# Patient Record
Sex: Female | Born: 1965 | Race: Black or African American | Hispanic: No | Marital: Single | Smoking: Never smoker
Health system: Southern US, Community
[De-identification: ages and names within clinical notes are randomized; demographics above are authoritative.]

## PROBLEM LIST (undated history)

## (undated) DIAGNOSIS — T4145XA Adverse effect of unspecified anesthetic, initial encounter: Secondary | ICD-10-CM

## (undated) DIAGNOSIS — E119 Type 2 diabetes mellitus without complications: Secondary | ICD-10-CM

## (undated) DIAGNOSIS — T8859XA Other complications of anesthesia, initial encounter: Secondary | ICD-10-CM

## (undated) DIAGNOSIS — K219 Gastro-esophageal reflux disease without esophagitis: Secondary | ICD-10-CM

## (undated) DIAGNOSIS — R51 Headache: Secondary | ICD-10-CM

## (undated) DIAGNOSIS — J302 Other seasonal allergic rhinitis: Secondary | ICD-10-CM

## (undated) DIAGNOSIS — M5430 Sciatica, unspecified side: Secondary | ICD-10-CM

## (undated) DIAGNOSIS — I10 Essential (primary) hypertension: Secondary | ICD-10-CM

## (undated) DIAGNOSIS — M5126 Other intervertebral disc displacement, lumbar region: Secondary | ICD-10-CM

## (undated) HISTORY — PX: DIAGNOSTIC LAPAROSCOPY: SUR761

---

## 1998-03-14 ENCOUNTER — Ambulatory Visit (HOSPITAL_COMMUNITY): Admission: RE | Admit: 1998-03-14 | Discharge: 1998-03-14 | Payer: Self-pay | Admitting: Family Medicine

## 1998-04-30 ENCOUNTER — Ambulatory Visit (HOSPITAL_COMMUNITY): Admission: RE | Admit: 1998-04-30 | Discharge: 1998-04-30 | Payer: Self-pay | Admitting: Obstetrics and Gynecology

## 2004-03-28 ENCOUNTER — Other Ambulatory Visit: Admission: RE | Admit: 2004-03-28 | Discharge: 2004-03-28 | Payer: Self-pay | Admitting: Obstetrics and Gynecology

## 2004-04-15 ENCOUNTER — Inpatient Hospital Stay (HOSPITAL_COMMUNITY): Admission: AD | Admit: 2004-04-15 | Discharge: 2004-04-16 | Payer: Self-pay | Admitting: Obstetrics and Gynecology

## 2004-09-19 ENCOUNTER — Inpatient Hospital Stay (HOSPITAL_COMMUNITY): Admission: AD | Admit: 2004-09-19 | Discharge: 2004-09-22 | Payer: Self-pay | Admitting: Obstetrics and Gynecology

## 2004-09-19 ENCOUNTER — Encounter (INDEPENDENT_AMBULATORY_CARE_PROVIDER_SITE_OTHER): Payer: Self-pay | Admitting: *Deleted

## 2004-10-26 ENCOUNTER — Other Ambulatory Visit: Admission: RE | Admit: 2004-10-26 | Discharge: 2004-10-26 | Payer: Self-pay | Admitting: Obstetrics and Gynecology

## 2005-08-27 HISTORY — PX: ABDOMINAL HYSTERECTOMY: SHX81

## 2006-05-06 ENCOUNTER — Ambulatory Visit (HOSPITAL_COMMUNITY): Admission: RE | Admit: 2006-05-06 | Discharge: 2006-05-07 | Payer: Self-pay | Admitting: Obstetrics and Gynecology

## 2006-05-06 ENCOUNTER — Encounter (INDEPENDENT_AMBULATORY_CARE_PROVIDER_SITE_OTHER): Payer: Self-pay | Admitting: *Deleted

## 2008-08-14 ENCOUNTER — Emergency Department (HOSPITAL_COMMUNITY): Admission: EM | Admit: 2008-08-14 | Discharge: 2008-08-14 | Payer: Self-pay | Admitting: Emergency Medicine

## 2008-11-25 ENCOUNTER — Emergency Department (HOSPITAL_COMMUNITY): Admission: EM | Admit: 2008-11-25 | Discharge: 2008-11-25 | Payer: Self-pay | Admitting: Emergency Medicine

## 2009-06-09 ENCOUNTER — Emergency Department (HOSPITAL_COMMUNITY): Admission: EM | Admit: 2009-06-09 | Discharge: 2009-06-09 | Payer: Self-pay | Admitting: Family Medicine

## 2010-02-22 ENCOUNTER — Emergency Department (HOSPITAL_COMMUNITY): Admission: EM | Admit: 2010-02-22 | Discharge: 2010-02-22 | Payer: Self-pay | Admitting: Family Medicine

## 2010-11-12 LAB — HEMOCCULT GUIAC POC 1CARD (OFFICE): Fecal Occult Bld: POSITIVE

## 2010-12-26 ENCOUNTER — Inpatient Hospital Stay (INDEPENDENT_AMBULATORY_CARE_PROVIDER_SITE_OTHER)
Admission: RE | Admit: 2010-12-26 | Discharge: 2010-12-26 | Disposition: A | Discharge status: Home / Self Care | Payer: 59 | Source: Ambulatory Visit | Attending: Emergency Medicine | Admitting: Emergency Medicine

## 2010-12-26 DIAGNOSIS — B373 Candidiasis of vulva and vagina: Secondary | ICD-10-CM

## 2010-12-26 DIAGNOSIS — N390 Urinary tract infection, site not specified: Secondary | ICD-10-CM

## 2010-12-26 LAB — POCT URINALYSIS DIP (DEVICE)
Glucose, UA: NEGATIVE mg/dL
Nitrite: NEGATIVE
Protein, ur: NEGATIVE mg/dL
Urobilinogen, UA: 0.2 mg/dL (ref 0.0–1.0)

## 2010-12-26 LAB — WET PREP, GENITAL: Yeast Wet Prep HPF POC: NONE SEEN

## 2010-12-27 LAB — GC/CHLAMYDIA PROBE AMP, GENITAL: GC Probe Amp, Genital: NEGATIVE

## 2011-01-12 NOTE — Discharge Summary (Signed)
Samantha Clark, Samantha Clark                 ACCOUNT NO.:  [id number]   MEDICAL RECORD NO.:  [id number]          PATIENT TYPE:  INP   LOCATION:  9121                          FACILITY:  [facility]   PHYSICIAN:  Dineen Kid. Rana Snare, M.D.    DATE OF BIRTH:  12-02-1965   DATE OF ADMISSION:  09/19/2004  DATE OF DISCHARGE:  09/22/2004                                 DISCHARGE SUMMARY   ADMITTING DIAGNOSIS:  1.  Intrauterine pregnancy at 35-4/7 weeks estimated gestational age.  2.  Chronic hypertension.  3.  Decreased fetal movement.  4.  Borderline amniotic fluid index, with intrauterine growth retardation      noted on baby, with abnormal Doppler study.   DISCHARGE DIAGNOSES:  1.  Status post low transverse cesarean section secondary to failure to      progress and intrauterine growth retardation.  2.  Viable female infant.   PROCEDURE:  Primary low transverse cesarean section.   REASON FOR ADMISSION:  Please see written H&P.   HOSPITAL COURSE:  The patient is a 45 year old gravida 2 para 1 who was  admitted to [facility] at 35-4/7 weeks estimated  gestational age for an induction of labor.  The patient had presented from  the office with decreased fetal movement over the past few days.  The  patient had chronic hypertension which was controlled with atenolol.  Ultrasound had been performed today which revealed an estimated fetal weight  in the less than 10th percentile for gestational age.  AFI was 6.5.  Doppler  studies revealed uterine artery at 3.5 and MCA which was within normal  limits.  Biophysical profile was 8/8.  Group B beta Streptococcus culture  was unknown, and therefore IV antibiotics were given prophylactically.  Blood pressure on admission was 132.77.  Fetal heart tones were reactive,  with an occasional spontaneous deceleration which was noted.  The patient  was without contractions.  Cervix was 1 cm dilated, with vertex that was  high in the pelvis.  PIH labs were  within normal limits.  Decision was made  to place Cervidil for ripening up the cervix, with close monitoring for  further decelerations.  Later that evening, review of the tracing revealed  fetal heart tones which were decreased variability and three spontaneous  variable decelerations which were questionable late in their presentation.  Decision was made to proceed with the cesarean delivery due to nonreassuring  fetal heart tones.  The patient was then taken to the operating room, where  spinal anesthesia was administered without difficulty.  A low transverse  incision was made, with the delivery a viable female infant weighing 4 pounds  9 ounces, with Apgars of 6 at one minute and 9 at five minutes.  Baby was  taken to the NICU, and the patient tolerated the procedure well and was  taken to the recovery room in stable condition.  On postoperative day one,  the patient did complain of some nausea without vomiting.  Vital signs were  stable.  She was afebrile.  Blood pressure was 135/90-139/84.  Deep tendon  reflexes  were 1+ without clonus.  Abdomen was soft, with good return of  bowel function.  Abdominal dressing was noted to have a small amount of  drainage noted on the right margin of the dressing.  Labs revealed  hemoglobin of 11.3, WBC count of 15.6, platelet count of 240,000, AST was  17, ALT was 9.  On postoperative day two, the patient was without complaint.  Baby was stable in the NICU.  Vital signs were stable.  Patient was  afebrile.  Blood pressure 129/79-134/78.  Abdomen was soft.  Fundus was firm  and nontender.  Abdominal dressing was clean, dry, and intact.  On  postoperative day three, the patient denied headache, blurred vision, or CNS  symptoms.  Vital signs were stable.  She was afebrile.  Blood pressure 129-  151/79-97.  Abdomen was soft.  Fundus was firm and nontender.  Incision was  clean, dry, and intact.  Staples removed, and the patient was discharged  home.    CONDITION ON DISCHARGE:  Stable.   DIET:  Regular, as tolerated.   ACTIVITY:  No heavy lifting, no driving x 2 weeks, no vaginal entry.   FOLLOWUP:  The patient is to follow up in the office in one week for an  incision check.  She is to call for temperature greater than 100 degrees,  persistent nausea and vomiting, heavy vaginal bleeding, and/or redness or  drainage from the incisional site.  The patient was also instructed to call  for headache unrelieved with Tylenol, blurred vision, right upper quadrant  pain, or epigastric discomfort.   DISCHARGE MEDICATIONS:  1.  Tylox, #30, one p.o. q.4-6 h. p.r.n.  2.  Motrin 600 mg q.6 h. p.r.n.  3.  Atenolol 25 mg daily.  4.  Prenatal vitamins 1 p.o. daily.      CC/MEDQ  D:  10/09/2004  T:  10/09/2004  Job:  [id number]

## 2011-01-12 NOTE — Op Note (Signed)
NAMEMarland Kitchen  Samantha Clark, Samantha Clark                 ACCOUNT NO.:  [id number]   MEDICAL RECORD NO.:  [id number]          PATIENT TYPE:  AMB   LOCATION:  SDC                           [facility]ILITY:  [facility]   PHYSICIAN:  Guy Sandifer. Henderson Cloud, M.D. DATE OF BIRTH:  [birth year]   DATE OF PROCEDURE:  05/06/2006  DATE OF DISCHARGE:                                 OPERATIVE REPORT   PREOPERATIVE DIAGNOSES:  1. Menorrhagia.  2. Uterine leiomyomata.   POSTOPERATIVE DIAGNOSES:  1. Menorrhagia.  2. Uterine leiomyomata.  3. Left ovarian cyst.   PROCEDURES:  Laparoscopically-assisted vaginal hysterectomy.   SURGEON:  Harold Hedge, MD.   ASSISTANT:  Varney Baas, MD.   ANESTHESIA:  General with endotracheal intubation, Cristela Blue, MD.   SPECIMENS:  Uterus to pathology.   ESTIMATED BLOOD LOSS:  200 mL.   INDICATIONS AND CONSENT:  This patient is a 45 year old single black female,  G2, P1 with heavy menses.  Details are dictated in the history and physical.  Laparoscopically-assisted vaginal hysterectomy and removal of one or both  ovaries if distinctly abnormal has been discussed preoperatively.  The  potential risks and complications have been reviewed preoperatively  including but not limited to infection, bowel, bladder, ureteral damage,  bleeding requiring transfusion of blood products with possible transfusion  reaction, HIV and hepatitis acquisition, DVT, PE, pneumonia, fistula  formation, laparotomy, dyspareunia, pelvic pain.  All questions have been  answered and consent is signed on the chart.   FINDINGS:  Upper abdomen is grossly normal.  The uterus is about 6-8 weeks  in size with multiple small leiomyomata.  The right ovary is normal.  The  left ovary contains a 3 cm translucent cyst that spontaneously drains with  manipulation.  The anterior cul-de-sac is normal.  The posterior cul-de-sac  contains a filmy adhesion over the left uterosacral ligament.   DESCRIPTION OF PROCEDURE:  The patient is  taken to the operating room where  she is identified, placed in dorsal supine position and general anesthesia  is induced via endotracheal intubation.  She is then placed in the dorsal  lithotomy position where she is prepped abdominally and vaginally, bladder  straight catheterized.  The Hulka tenaculum was placed and a uterus  manipulator and she is draped in the sterile fashion.  A small  infraumbilical incision was made and a disposable Veress needle was placed  on the first attempt without difficulty.  A normal syringe and drop test are  noted.  2 liters of gas were then insufflated under low pressure with good  tympany in the right upper quadrant.  Then a 10/11 bladeless disposable XL  trocar sleeve was placed using direct visualization with the diagnostic  laparoscope.  This was then replaced with the operative laparoscope.  A  small suprapubic incision was made and a 5-mm bladeless disposable XL trocar  sleeve was placed under direct visualization without difficulty.  The subcu  tissues were insufflated with 0.5% plain Marcaine prior to both of the above  incisions.  After noting the above findings, the left ovarian cyst is noted  to drain  clear serous fluid upon manipulation.  Hemostasis is obtained on  the ovary with bipolar cautery.  Then using the gyrus bipolar cautery  cutting instrument, the proximal ligaments are taken down to the level of  the vesicouterine peritoneum bilaterally.  The vesicouterine peritoneum is  taken down cephalolaterally as well.  Good hemostasis is noted.  The  suprapubic trocar sleeve is removed.  All instruments are removed and  attention is turned to the vagina.  The posterior cul-de-sac is entered  sharply and the cervix is circumscribed with unipolar cautery.  The vaginal  mucosa is advanced sharply and bluntly.  Then using the gyrus bipolar  instrument, the uterosacral ligaments followed by the bladder pillars,  cardinal ligaments and uterine  vessels are taken down bilaterally.  The  anterior cul-de-sac is entered without difficulty.  After the proximal  ligaments are taken down, the fundus is delivered posteriorly.  The  remaining pedicles are taken down and the specimen is delivered.  The  uterosacral ligaments  are then plicated in the vaginal cuff bilaterally  with #0 Monocryl suture.  All suture will be #0 Monocryl unless otherwise  designated.  The uterosacral ligaments are then plicated in midline.  The  cuff is closed with figure-of-eights.  A Foley catheter is placed in the  bladder and clear urine is noted.  Attention is returned to the abdomen.  Pneumoperitoneum is reintroduced and copious irrigation is carried out.  Good hemostasis is noted across all sites.  Inspection under reduced  pneumoperitoneum again reveals good hemostasis.  The suprapubic trocar  sleeve is removed.  Pneumoperitoneum is reduced and the umbilical trocar  sleeve is removed.  A #0 Vicryl suture is used to reapproximate the  subcutaneous tissues in the umbilical incision with care being taken not to  pick up any underlying structures.  Both skin incisions are closed with  Dermabond.  All counts are correct.  The patient is awakened and taken to  the recovery room in stable condition.      Guy Sandifer Henderson Cloud, M.D.  Electronically Signed     JET/MEDQ  D:  05/06/2006  T:  05/06/2006  Job:  [id number]

## 2011-01-12 NOTE — Discharge Summary (Signed)
NAMEMarland Clark  ROSLIN, NORWOOD                 ACCOUNT NO.:  [id number]   MEDICAL RECORD NO.:  [id number]          PATIENT TYPE:  OIB   LOCATION:  9303                          [facility]ACILITY:  [facility]   PHYSICIAN:  Guy Sandifer. Henderson Cloud, M.D. DATE OF BIRTH:  09/08/1965   DATE OF ADMISSION:  05/06/2006  DATE OF DISCHARGE:  05/07/2006                                 DISCHARGE SUMMARY   ADMITTING DIAGNOSES:  1. Menorrhagia.  2. Uterine leiomyomata.   DISCHARGE DIAGNOSES:  1. Menorrhagia.  2. Uterine leiomyomata.  3. Left ovarian cyst.   PROCEDURE:  On May 06, 2006, laparoscopically-assisted vaginal  hysterectomy.   REASON FOR ADMISSION:  This patient is a 45 year old black female, G2, P1,  with heavy painful menses.  Details are dictated in the history and  physical.  She is to the hospital for surgical management.   HOSPITAL COURSE:  The patient has been in the hospital, undergoes the above  procedure.  On the evening of surgery vital signs are stable.  She is  afebrile with a clear urine output.  She has good bowel sounds and her  abdomen is soft.  On the day of discharge, hemoglobin is 11.0.  Pathology is  pending.  She is tolerating a regular diet, passing flatus, voiding well,  and ambulating.  Vital signs are stable and she is afebrile.   CONDITION ON DISCHARGE:  Good.   DIET:  Regular as tolerated.   ACTIVITY:  No lifting, no operation of automobiles, no vaginal entry.   She is to call the office for problems including, but not limited to,  temperature of 101 degrees, heavy vaginal bleeding, increasing pain or  persistent nausea, vomiting.   FOLLOWUP:  Is in the office in 1-2 weeks.   MEDICATIONS:  1. Ibuprofen 600 mg q.6 h p.r.n.  2. Multivitamin daily.  3. Percocet 5/225 mg, #30, one to two p.o. q.6 h p.r.n.   LABORATORY:  Thank you      Guy Sandifer. Henderson Cloud, M.D.  Electronically Signed     JET/MEDQ  D:  05/07/2006  T:  05/07/2006  Job:  [id number]

## 2011-01-12 NOTE — Op Note (Signed)
NAMESENTORIA, Samantha Clark                 ACCOUNT NO.:  [id number]   MEDICAL RECORD NO.:  [id number]          PATIENT TYPE:  INP   LOCATION:  9121                          [facility]ILITY[facility]   PHYSICIAN:  Michelle L. Grewal, M.D.DATE OF BIRTH:  1966-02-22   DATE OF PROCEDURE:  09/19/2004  DATE OF DISCHARGE:                                 OPERATIVE REPORT   PREOPERATIVE DIAGNOSES:  1.  Intrauterine pregnancy at 35-4/7 weeks.  2.  Chronic hypertension.  3.  Intrauterine growth restriction.  4.  Oligohydramnios.  5.  Abnormal Dopplers.  6.  Nonreassuring fetal heart rate.   POSTOPERATIVE DIAGNOSES:  1.  Intrauterine pregnancy at 35-4/7 weeks.  2.  Chronic hypertension.  3.  Intrauterine growth restriction.  4.  Oligohydramnios.  5.  Abnormal Dopplers.  6.  Nonreassuring fetal heart rate.  7.  Fibroids.   PROCEDURE:  Primary low transverse cesarean section.   SURGEON:  Michelle L. Vincente Poli, M.D.   ANESTHESIA:  Spinal.   ESTIMATED BLOOD LOSS:  500 mL.   FINDINGS:  Multiple fibroids.  A female infant with Apgars 6 at one minute, 9  at five minutes, and a weight of 1883 g.   PROCEDURE:  The patient was taken to the operating room.  She was given her  spinal without incident.  She was prepped and draped in the usual sterile  fashion.  A Foley catheter was inserted into the bladder.  A low transverse  incision was made, carried down to the fascia.  The fascia was scored in the  midline and extended laterally.  The rectus muscles were separated in the  midline and the peritoneum was entered bluntly.  The peritoneal incision was  then stretched, the bladder blade was inserted, the lower uterine segment  was identified, and the bladder flap was created sharply and then digitally.  A low transverse incision was made and it was noted that there were multiple  fibroids in the uterus.  The baby was in cephalic presentation, was  delivered easily with one pull of the vacuum.  Apgars were 6 at one  minute  and 9 at five minutes, and weight was 1883 g.  The cord was noted to be  wrapped around the baby's feet and hands.  The baby was handed to the  waiting neonatologist and taken to the neonatal ICU because of IUGR.  The  cord was clamped and cut and placenta was removed and noted to be very  mature and small.  It was sent to pathology.  The uterus was exteriorized  and cleared of all clots and debris.  There were multiple fibroids noted,  but her uterus clamped down actually extremely well with the Pitocin.  Antibiotics were also given.  The uterus was closed in a single layer using  0 chromic in a continuous running locked stitch.  It was hemostatic.  The  uterus was returned to the abdomen.  Irrigation was performed, and again  hemostasis was noted.  The peritoneum was closed using 0 Vicryl in a  continuous running stitch, and the rectus muscles were  reapproximated using  the same 0 Vicryl.  The fascia was closed using 0 Vicryl in a continuous  running stitch, starting at each corner and meeting in the midline.  After  irrigation in the subcutaneous layer and noting hemostasis, the skin was  closed with staples.  All sponge, lap and instrument counts were correct at  the end of the procedure.  The patient tolerated the procedure well and went  to the recovery room in stable condition.      MLG/MEDQ  D:  09/19/2004  T:  09/20/2004  Job:  [id number]

## 2011-01-12 NOTE — H&P (Signed)
NAMEMarland Kitchen  Samantha Clark, Samantha Clark                 ACCOUNT NO.:  [id number]   MEDICAL RECORD NO.:  [id number]          PATIENT TYPE:  AMB   LOCATION:                                [facility]   PHYSICIAN:  Guy Sandifer. Henderson Cloud, M.D.      DATE OF BIRTH:   DATE OF ADMISSION:  05/06/2006  DATE OF DISCHARGE:                                HISTORY & PHYSICAL   CHIEF COMPLAINT:  Heavy menses.   HISTORY OF PRESENT ILLNESS:  This patient is a 45 year old, single, black  female, G2, P1, who has heavy painful menses.  She is passing large clots.  This sometimes takes her off of her feet with the heavy bleeding.  She has a  history of leiomyomata noted during pregnancy.  Ultrasound in my office on  March 13, 2006, revealed the uterus measuring 8.6 x 5.2 x 6.1-cm, with 2.6  and 2.2-cm intramural fibroids, and a 3.1-cm pedunculated fibroid on the  right fundus noted.  Ovaries were essentially normal with a septate  hemorrhagic cyst on the right ovary measuring 2.9-cm.  After a careful  discussion of the options, she is being admitted for a laparoscopically  assisted vaginal hysterectomy.  She will have one or both tubes and ovaries  removed if distinctly abnormal.  Potential risks and complications have been  reviewed preoperatively.   PAST MEDICAL HISTORY:  1. History of endometriosis.  2. Chronic hypertension.   PAST SURGICAL HISTORY:  Laparoscopy in 1999, for endometriosis.   OBSTETRIC HISTORY:  1. Miscarriage x1.  2. Cesarean section x1.   FAMILY HISTORY:  Chronic hypertension - mother, father, maternal  grandmother.  Diabetes - father, paternal grandmother.  Kidney disease in  father.  Stroke in maternal grandmother.  Cerebral aneurysm in mother.  Breast and pancreatic cancer - maternal grandmother.  Colon cancer -  maternal aunt.  Breast cancer - maternal first cousin.  Alcoholism in both  parents.   MEDICATIONS:  1. Atenolol 25 mg daily.  2. Hydrochlorothiazide 25 mg daily.   NO KNOWN DRUG  ALLERGIES.   REVIEW OF SYSTEMS:  NEURO:  Occasional headaches.  CARDIAC:  Denies chest  pain.  PULMONARY:  Denies shortness of breath.  GI:  Denies recent changes  in bowel habits.   PHYSICAL EXAMINATION:  VITAL SIGNS:  Height 5 feet 5 and 1/2 inches, weight  274 pounds, blood pressure 122/80.  HEENT:  Without thyromegaly.  LUNGS:  Clear to auscultation.  HEART:  Regular rate and rhythm.  BACK:  Without CVA tenderness.  BREASTS:  Without mass, tracks, discharge.  ABDOMEN:  Obese, soft, nontender without palpable masses.  PELVIC:  Vulva, vagina, cervix without lesion.  Pelvic exam __________ .  Uterus is mobile and nontender.  Adnexa nontender without masses.  EXTREMITIES:  Grossly within normal limits.  NEUROLOGIC:  Grossly within normal limits.   ASSESSMENT:  1. Menorrhagia.  2. Fibroids.  3. History of endometriosis.   PLAN:  Laparoscopically assisted vaginal hysterectomy, removal of one or  both ovaries if distinctly abnormal.      Guy Sandifer. Henderson Cloud, M.D.  Electronically Signed     JET/MEDQ  D:  05/01/2006  T:  05/01/2006  Job:  [id number]

## 2011-06-01 LAB — POCT I-STAT, CHEM 8
BUN: 8 mg/dL (ref 6–23)
Creatinine, Ser: 1 mg/dL (ref 0.4–1.2)
Hemoglobin: 14.6 g/dL (ref 12.0–15.0)
Potassium: 3.4 mEq/L — ABNORMAL LOW (ref 3.5–5.1)
Sodium: 139 mEq/L (ref 135–145)

## 2011-06-01 LAB — CBC
Platelets: 320 10*3/uL (ref 150–400)
RBC: 4.56 MIL/uL (ref 3.87–5.11)

## 2011-06-01 LAB — DIFFERENTIAL
Basophils Absolute: 0 10*3/uL (ref 0.0–0.1)
Eosinophils Absolute: 0.1 10*3/uL (ref 0.0–0.7)
Lymphocytes Relative: 27 % (ref 12–46)
Lymphs Abs: 3.1 10*3/uL (ref 0.7–4.0)
Neutrophils Relative %: 68 % (ref 43–77)

## 2011-06-01 LAB — POCT CARDIAC MARKERS
CKMB, poc: <1 ng/mL — ABNORMAL LOW (ref 1.0–8.0)
Myoglobin, poc: 77.3 ng/mL (ref 12–200)

## 2012-12-08 ENCOUNTER — Other Ambulatory Visit: Payer: Self-pay | Admitting: Family Medicine

## 2012-12-08 ENCOUNTER — Ambulatory Visit
Admission: RE | Admit: 2012-12-08 | Discharge: 2012-12-08 | Disposition: A | Discharge status: Home / Self Care | Payer: 59 | Source: Ambulatory Visit | Attending: Family Medicine | Admitting: Family Medicine

## 2012-12-08 DIAGNOSIS — R319 Hematuria, unspecified: Secondary | ICD-10-CM

## 2012-12-08 DIAGNOSIS — M549 Dorsalgia, unspecified: Secondary | ICD-10-CM

## 2012-12-11 ENCOUNTER — Other Ambulatory Visit: Payer: Self-pay | Admitting: Family Medicine

## 2012-12-11 DIAGNOSIS — R9389 Abnormal findings on diagnostic imaging of other specified body structures: Secondary | ICD-10-CM

## 2012-12-12 ENCOUNTER — Ambulatory Visit
Admission: RE | Admit: 2012-12-12 | Discharge: 2012-12-12 | Disposition: A | Discharge status: Home / Self Care | Payer: 59 | Source: Ambulatory Visit | Attending: Family Medicine | Admitting: Family Medicine

## 2012-12-12 DIAGNOSIS — R9389 Abnormal findings on diagnostic imaging of other specified body structures: Secondary | ICD-10-CM

## 2012-12-25 ENCOUNTER — Other Ambulatory Visit (HOSPITAL_COMMUNITY): Payer: Self-pay | Admitting: Family Medicine

## 2012-12-25 DIAGNOSIS — N83209 Unspecified ovarian cyst, unspecified side: Secondary | ICD-10-CM

## 2012-12-30 ENCOUNTER — Ambulatory Visit (HOSPITAL_COMMUNITY)
Admission: RE | Admit: 2012-12-30 | Discharge: 2012-12-30 | Disposition: A | Discharge status: Home / Self Care | Payer: 59 | Source: Ambulatory Visit | Attending: Family Medicine | Admitting: Family Medicine

## 2012-12-30 DIAGNOSIS — N839 Noninflammatory disorder of ovary, fallopian tube and broad ligament, unspecified: Secondary | ICD-10-CM | POA: Insufficient documentation

## 2012-12-30 DIAGNOSIS — N83209 Unspecified ovarian cyst, unspecified side: Secondary | ICD-10-CM

## 2012-12-30 DIAGNOSIS — Z90710 Acquired absence of both cervix and uterus: Secondary | ICD-10-CM | POA: Insufficient documentation

## 2012-12-30 MED ORDER — GADOBENATE DIMEGLUMINE 529 MG/ML IV SOLN
20.0000 mL | Freq: Once | INTRAVENOUS | Status: AC | PRN
  Administered 2012-12-30: 20 mL via INTRAVENOUS

## 2013-01-29 ENCOUNTER — Encounter (HOSPITAL_COMMUNITY): Payer: Self-pay | Admitting: Pharmacy Technician

## 2013-02-03 ENCOUNTER — Encounter (HOSPITAL_COMMUNITY)
Admission: RE | Admit: 2013-02-03 | Discharge: 2013-02-03 | Disposition: A | Discharge status: Home / Self Care | Payer: 59 | Source: Ambulatory Visit | Attending: Obstetrics and Gynecology | Admitting: Obstetrics and Gynecology

## 2013-02-03 ENCOUNTER — Encounter (HOSPITAL_COMMUNITY): Payer: Self-pay

## 2013-02-03 DIAGNOSIS — Z01812 Encounter for preprocedural laboratory examination: Secondary | ICD-10-CM | POA: Insufficient documentation

## 2013-02-03 DIAGNOSIS — Z01818 Encounter for other preprocedural examination: Secondary | ICD-10-CM | POA: Insufficient documentation

## 2013-02-03 HISTORY — DX: Other seasonal allergic rhinitis: J30.2

## 2013-02-03 HISTORY — DX: Essential (primary) hypertension: I10

## 2013-02-03 HISTORY — DX: Gastro-esophageal reflux disease without esophagitis: K21.9

## 2013-02-03 HISTORY — DX: Headache: R51

## 2013-02-03 LAB — BASIC METABOLIC PANEL
BUN: 10 mg/dL (ref 6–23)
Calcium: 10.2 mg/dL (ref 8.4–10.5)
Chloride: 100 mEq/L (ref 96–112)
Creatinine, Ser: 0.75 mg/dL (ref 0.50–1.10)
GFR calc Af Amer: >90 mL/min (ref >90)

## 2013-02-03 LAB — SURGICAL PCR SCREEN
MRSA, PCR: NEGATIVE
Staphylococcus aureus: NEGATIVE

## 2013-02-03 LAB — CBC
HCT: 40.3 % (ref 36.0–46.0)
MCHC: 33.5 g/dL (ref 30.0–36.0)
MCV: 89 fL (ref 78.0–100.0)
Platelets: 347 10*3/uL (ref 150–400)
RDW: 12.9 % (ref 11.5–15.5)

## 2013-02-03 NOTE — Patient Instructions (Addendum)
   Your procedure is scheduled on: Tuesday, June 17  Enter through the [employer] of [facility] at: 6 am Pick up the phone at the desk and dial [phone] and inform us of your arrival.  Please call this number if you have any problems the morning of surgery: [phone]  Remember: Do not eat or drink after midnight: Monday Take these medicines the morning of surgery with a SIP OF WATER: BP MEDS   Do not wear jewelry, make-up, or FINGER nail polish No metal in your hair or on your body. Do not wear lotions, powders, perfumes. You may wear deodorant.  Please use your CHG wash as directed prior to surgery.  Do not shave anywhere for at least 12 hours prior to first CHG shower.  Do not bring valuables to the hospital. Contacts, dentures or bridgework may not be worn into surgery.  Patients discharged on the day of surgery will not be allowed to drive home.  Home with Shawn [facility]  cell [phone].

## 2013-02-09 MED ORDER — CEFAZOLIN SODIUM 10 G IJ SOLR
3.0000 g | INTRAMUSCULAR | Status: AC
  Administered 2013-02-10: 3 g via INTRAVENOUS
  Filled 2013-02-09: qty 3000

## 2013-02-09 NOTE — H&P (Signed)
Samantha Clark is an 47 y.o. female S/P LAVH with known endometriosis has increasingly severe RLQ/right pelvic pain. U/S , CT, and MRI have been C/W 1.9cm calcification on right ovary. Normal appearing left ovary. CA-125 normal. Patient desires BSO. Pertinent Gynecological History: Menses: N/A Bleeding: N/A Contraception: status post hysterectomy DES exposure: denies Blood transfusions: none Sexually transmitted diseases: no past history Previous GYN Procedures: LAVH  Last mammogram: normal Date: 2014 Last pap: normal Date: 2014 OB History: G1, P1   Menstrual History: Menarche age: unknown  No LMP recorded. Patient has had a hysterectomy.    Past Medical History  Diagnosis Date  . GERD (gastroesophageal reflux disease)   . Hypertension   . Seasonal allergies   . Headache(784.0)     otc med prn    Past Surgical History  Procedure Laterality Date  . Abdominal hysterectomy    . Cesarean section      x 1  . Diagnostic laparoscopy      endometriosis and pelvic pain    No family history on file.  Social History:  reports that she has never smoked. She has never used smokeless tobacco. She reports that she does not drink alcohol or use illicit drugs.  Allergies:  Allergies  Allergen Reactions  . Ibuprofen Itching    Has not taken other OTC NSAIDS    No prescriptions prior to admission    Review of Systems  Constitutional: Negative for fever.  Genitourinary:       RLQ pain    There were no vitals taken for this visit. Physical Exam  Cardiovascular: Normal rate and regular rhythm.   Respiratory: Effort normal and breath sounds normal.  GI: Soft. Bowel sounds are normal.  Genitourinary:  NT without palpable masses    No results found for this or any previous visit (from the past 24 hour(s)).  No results found.  Assessment/Plan: 47 yo S/P LAVH with RLQ pain and calcified cyst on right ovary for L/S with BSO. Risks reviewed as well as sxs of menopause,  options of treatment and risks. All questions answered.   [name] II,Jerrit Horen E 02/09/2013, 2:06 PM

## 2013-02-10 ENCOUNTER — Encounter (HOSPITAL_COMMUNITY): Payer: Self-pay | Admitting: Anesthesiology

## 2013-02-10 ENCOUNTER — Encounter (HOSPITAL_COMMUNITY): Payer: Self-pay

## 2013-02-10 ENCOUNTER — Encounter (HOSPITAL_COMMUNITY)
Admission: RE | Disposition: A | Discharge status: Home / Self Care | Payer: Self-pay | Source: Ambulatory Visit | Attending: Obstetrics and Gynecology

## 2013-02-10 ENCOUNTER — Ambulatory Visit (HOSPITAL_COMMUNITY)
Admission: RE | Admit: 2013-02-10 | Discharge: 2013-02-10 | Disposition: A | Discharge status: Home / Self Care | Payer: 59 | Source: Ambulatory Visit | Attending: Obstetrics and Gynecology | Admitting: Obstetrics and Gynecology

## 2013-02-10 ENCOUNTER — Ambulatory Visit (HOSPITAL_COMMUNITY): Payer: 59 | Admitting: Anesthesiology

## 2013-02-10 DIAGNOSIS — N83209 Unspecified ovarian cyst, unspecified side: Secondary | ICD-10-CM | POA: Insufficient documentation

## 2013-02-10 DIAGNOSIS — I10 Essential (primary) hypertension: Secondary | ICD-10-CM | POA: Insufficient documentation

## 2013-02-10 DIAGNOSIS — J301 Allergic rhinitis due to pollen: Secondary | ICD-10-CM | POA: Insufficient documentation

## 2013-02-10 DIAGNOSIS — Z90710 Acquired absence of both cervix and uterus: Secondary | ICD-10-CM | POA: Insufficient documentation

## 2013-02-10 DIAGNOSIS — K219 Gastro-esophageal reflux disease without esophagitis: Secondary | ICD-10-CM | POA: Insufficient documentation

## 2013-02-10 HISTORY — PX: LAPAROSCOPY: SHX197

## 2013-02-10 HISTORY — PX: BILATERAL SALPINGECTOMY: SHX5743

## 2013-02-10 SURGERY — LAPAROSCOPY OPERATIVE
Anesthesia: General | Site: Abdomen | Wound class: Clean

## 2013-02-10 MED ORDER — INDIGOTINDISULFONATE SODIUM 8 MG/ML IJ SOLN
INTRAMUSCULAR | Status: AC
  Filled 2013-02-10: qty 5

## 2013-02-10 MED ORDER — PROMETHAZINE HCL 25 MG/ML IJ SOLN
INTRAMUSCULAR | Status: AC
  Filled 2013-02-10: qty 1

## 2013-02-10 MED ORDER — LACTATED RINGERS IV SOLN
INTRAVENOUS | Status: DC
  Administered 2013-02-10: 08:00:00 via INTRAVENOUS
  Administered 2013-02-10: 125 mL/h via INTRAVENOUS

## 2013-02-10 MED ORDER — LACTATED RINGERS IR SOLN
Status: DC | PRN
  Administered 2013-02-10: 3000 mL

## 2013-02-10 MED ORDER — PROPOFOL 10 MG/ML IV EMUL
INTRAVENOUS | Status: AC
  Filled 2013-02-10: qty 20

## 2013-02-10 MED ORDER — DEXAMETHASONE SODIUM PHOSPHATE 10 MG/ML IJ SOLN
INTRAMUSCULAR | Status: DC | PRN
  Administered 2013-02-10: 10 mg via INTRAVENOUS

## 2013-02-10 MED ORDER — LIDOCAINE HCL (CARDIAC) 20 MG/ML IV SOLN
INTRAVENOUS | Status: AC
  Filled 2013-02-10: qty 5

## 2013-02-10 MED ORDER — HYDROMORPHONE HCL PF 1 MG/ML IJ SOLN
INTRAMUSCULAR | Status: AC
  Filled 2013-02-10: qty 1

## 2013-02-10 MED ORDER — OXYCODONE-ACETAMINOPHEN 5-325 MG PO TABS: 2.0000 | ORAL_TABLET | Freq: Four times a day (QID) | ORAL | Status: DC | PRN

## 2013-02-10 MED ORDER — BUPIVACAINE HCL (PF) 0.5 % IJ SOLN
INTRAMUSCULAR | Status: DC | PRN
  Administered 2013-02-10: 20 mL
  Administered 2013-02-10: 10 mL

## 2013-02-10 MED ORDER — MIDAZOLAM HCL 2 MG/2ML IJ SOLN
INTRAMUSCULAR | Status: AC
  Filled 2013-02-10: qty 2

## 2013-02-10 MED ORDER — 0.9 % SODIUM CHLORIDE (POUR BTL) OPTIME
TOPICAL | Status: DC | PRN
  Administered 2013-02-10: 1000 mL

## 2013-02-10 MED ORDER — LIDOCAINE HCL (CARDIAC) 20 MG/ML IV SOLN
INTRAVENOUS | Status: DC | PRN
  Administered 2013-02-10: 60 mg via INTRAVENOUS

## 2013-02-10 MED ORDER — ACETAMINOPHEN 10 MG/ML IV SOLN: 1000.0000 mg | Freq: Once | INTRAVENOUS | Status: DC | PRN

## 2013-02-10 MED ORDER — ACETAMINOPHEN 160 MG/5ML PO SOLN: 975.0000 mg | Freq: Once | ORAL | Status: AC

## 2013-02-10 MED ORDER — HEPARIN SODIUM (PORCINE) 5000 UNIT/ML IJ SOLN
INTRAMUSCULAR | Status: DC | PRN
  Administered 2013-02-10: 5000 [IU]

## 2013-02-10 MED ORDER — NEOSTIGMINE METHYLSULFATE 1 MG/ML IJ SOLN
INTRAMUSCULAR | Status: DC | PRN
  Administered 2013-02-10: 3 mg via INTRAVENOUS

## 2013-02-10 MED ORDER — ONDANSETRON HCL 4 MG/2ML IJ SOLN
INTRAMUSCULAR | Status: AC
  Filled 2013-02-10: qty 2

## 2013-02-10 MED ORDER — NEOSTIGMINE METHYLSULFATE 1 MG/ML IJ SOLN
INTRAMUSCULAR | Status: AC
  Filled 2013-02-10: qty 1

## 2013-02-10 MED ORDER — GLYCOPYRROLATE 0.2 MG/ML IJ SOLN
INTRAMUSCULAR | Status: DC | PRN
  Administered 2013-02-10: 0.6 mg via INTRAVENOUS

## 2013-02-10 MED ORDER — ACETAMINOPHEN 160 MG/5ML PO SOLN
ORAL | Status: AC
  Administered 2013-02-10: 975 mg via ORAL
  Filled 2013-02-10: qty 40.6

## 2013-02-10 MED ORDER — ROCURONIUM BROMIDE 100 MG/10ML IV SOLN
INTRAVENOUS | Status: DC | PRN
  Administered 2013-02-10: 50 mg via INTRAVENOUS

## 2013-02-10 MED ORDER — GLYCOPYRROLATE 0.2 MG/ML IJ SOLN
INTRAMUSCULAR | Status: AC
  Filled 2013-02-10: qty 2

## 2013-02-10 MED ORDER — DEXAMETHASONE SODIUM PHOSPHATE 10 MG/ML IJ SOLN
INTRAMUSCULAR | Status: AC
  Filled 2013-02-10: qty 1

## 2013-02-10 MED ORDER — MIDAZOLAM HCL 5 MG/5ML IJ SOLN
INTRAMUSCULAR | Status: DC | PRN
  Administered 2013-02-10: 2 mg via INTRAVENOUS

## 2013-02-10 MED ORDER — MEPERIDINE HCL 25 MG/ML IJ SOLN: 6.2500 mg | INTRAMUSCULAR | Status: DC | PRN

## 2013-02-10 MED ORDER — HYDROMORPHONE HCL PF 1 MG/ML IJ SOLN
0.2500 mg | INTRAMUSCULAR | Status: DC | PRN
  Administered 2013-02-10: 0.25 mg via INTRAVENOUS

## 2013-02-10 MED ORDER — PROMETHAZINE HCL 25 MG/ML IJ SOLN
6.2500 mg | INTRAMUSCULAR | Status: DC | PRN
  Administered 2013-02-10: 6.25 mg via INTRAVENOUS

## 2013-02-10 MED ORDER — BUPIVACAINE HCL (PF) 0.5 % IJ SOLN
INTRAMUSCULAR | Status: AC
  Filled 2013-02-10: qty 30

## 2013-02-10 MED ORDER — PROPOFOL 10 MG/ML IV BOLUS
INTRAVENOUS | Status: DC | PRN
  Administered 2013-02-10: 200 mg via INTRAVENOUS

## 2013-02-10 MED ORDER — FENTANYL CITRATE 0.05 MG/ML IJ SOLN
INTRAMUSCULAR | Status: DC | PRN
  Administered 2013-02-10: 100 ug via INTRAVENOUS
  Administered 2013-02-10: 150 ug via INTRAVENOUS

## 2013-02-10 MED ORDER — ROCURONIUM BROMIDE 50 MG/5ML IV SOLN
INTRAVENOUS | Status: AC
  Filled 2013-02-10: qty 1

## 2013-02-10 MED ORDER — ACETAMINOPHEN 10 MG/ML IV SOLN
INTRAVENOUS | Status: AC
  Administered 2013-02-10: 1000 mg via INTRAVENOUS
  Filled 2013-02-10: qty 100

## 2013-02-10 MED ORDER — ONDANSETRON HCL 4 MG/2ML IJ SOLN
INTRAMUSCULAR | Status: DC | PRN
  Administered 2013-02-10: 4 mg via INTRAVENOUS

## 2013-02-10 MED ORDER — FENTANYL CITRATE 0.05 MG/ML IJ SOLN
INTRAMUSCULAR | Status: AC
  Filled 2013-02-10: qty 5

## 2013-02-10 SURGICAL SUPPLY — 32 items
ADH SKN CLS APL DERMABOND .7 (GAUZE/BANDAGES/DRESSINGS) ×1
BAG SPEC RTRVL LRG 6X4 10 (ENDOMECHANICALS) ×2
BARRIER ADHS 3X4 INTERCEED (GAUZE/BANDAGES/DRESSINGS) IMPLANT
BRR ADH 4X3 ABS CNTRL BYND (GAUZE/BANDAGES/DRESSINGS)
CABLE HIGH FREQUENCY MONO STRZ (ELECTRODE) IMPLANT
CATH ROBINSON RED A/P 16FR (CATHETERS) ×2 IMPLANT
CLOTH BEACON ORANGE TIMEOUT ST (SAFETY) ×2 IMPLANT
DERMABOND ADVANCED (GAUZE/BANDAGES/DRESSINGS) ×1
DERMABOND ADVANCED .7 DNX12 (GAUZE/BANDAGES/DRESSINGS) ×1 IMPLANT
GLOVE BIO SURGEON STRL SZ8 (GLOVE) ×4 IMPLANT
GLOVE INDICATOR 7.0 STRL GRN (GLOVE) ×1 IMPLANT
GLOVE SURG SS PI 6.5 STRL IVOR (GLOVE) ×1 IMPLANT
GOWN PREVENTION PLUS LG XLONG (DISPOSABLE) ×3 IMPLANT
NEEDLE INSUFFLATION 120MM (ENDOMECHANICALS) ×1 IMPLANT
NS IRRIG 1000ML POUR BTL (IV SOLUTION) ×2 IMPLANT
PACK LAPAROSCOPY BASIN (CUSTOM PROCEDURE TRAY) ×2 IMPLANT
POUCH SPECIMEN RETRIEVAL 10MM (ENDOMECHANICALS) ×2 IMPLANT
PROTECTOR NERVE ULNAR (MISCELLANEOUS) ×2 IMPLANT
SEALER TISSUE G2 CVD JAW 35 (ENDOMECHANICALS) IMPLANT
SEALER TISSUE G2 CVD JAW 45CM (ENDOMECHANICALS) ×1 IMPLANT
SET IRRIG TUBING LAPAROSCOPIC (IRRIGATION / IRRIGATOR) ×1 IMPLANT
STRIP CLOSURE SKIN 1/4X3 (GAUZE/BANDAGES/DRESSINGS) IMPLANT
SUT VIC AB 3-0 PS2 18 (SUTURE) ×2
SUT VIC AB 3-0 PS2 18XBRD (SUTURE) IMPLANT
SUT VICRYL 0 UR6 27IN ABS (SUTURE) ×2 IMPLANT
SYR 30ML LL (SYRINGE) IMPLANT
TOWEL OR 17X24 6PK STRL BLUE (TOWEL DISPOSABLE) ×4 IMPLANT
TROCAR BALLN GELPORT 12X130M (ENDOMECHANICALS) ×1 IMPLANT
TROCAR XCEL NON-BLD 11X100MML (ENDOMECHANICALS) IMPLANT
TROCAR XCEL NON-BLD 5MMX100MML (ENDOMECHANICALS) ×1 IMPLANT
WARMER LAPAROSCOPE (MISCELLANEOUS) ×2 IMPLANT
WATER STERILE IRR 1000ML POUR (IV SOLUTION) ×1 IMPLANT

## 2013-02-10 NOTE — Transfer of Care (Signed)
Immediate Anesthesia Transfer of Care Note  Patient: Samantha Clark  Procedure(s) Performed: Procedure(s): LAPAROSCOPY OPERATIVE (N/A) BILATERAL SALPINGECTOMY with peritoneal washings (N/A)  Patient Location: PACU  Anesthesia Type:General  Level of Consciousness: sedated  Airway & Oxygen Therapy: Patient Spontanous Breathing and Patient connected to nasal cannula oxygen  Post-op Assessment: Report given to PACU RN and Post -op Vital signs reviewed and stable  Post vital signs: stable  Complications: No apparent anesthesia complications

## 2013-02-10 NOTE — Op Note (Signed)
NAMEMarland Kitchen  EMILY, MASSAR                 ACCOUNT NO.:  [id number]  MEDICAL RECORD NO.:  [id number]  LOCATION:  [facility]                          FACILITY:  [facility]  PHYSICIAN:  Guy Sandifer. Henderson Cloud, M.D. DATE OF BIRTH:  05/30/1966  DATE OF PROCEDURE:  02/10/2013 DATE OF DISCHARGE:                              OPERATIVE REPORT   PREOPERATIVE DIAGNOSIS:  Right ovarian cyst.  POSTOPERATIVE DIAGNOSIS:  Right ovarian cyst.  PROCEDURE:  Laparoscopy with bilateral salpingo-oophorectomy and peritoneal washings.  SURGEON:  Guy Sandifer. Henderson Cloud, MD  ANESTHESIA:  General endotracheal intubation.  ESTIMATED BLOOD LOSS:  Drops.  SPECIMENS:  Bilateral fallopian tubes and ovaries, all to pathology.  INDICATIONS AND CONSENT:  This patient is a 47 year old married black female, G1, P1, status post LAVH, who has a 1.9-cm calcified cyst on the right ovary.  The details are dictated in the history and physical. Laparoscopic bilateral salpingo-oophorectomy was discussed with the patient preoperatively.  Potential risks and complications were reviewed preoperatively including but not limited to, infection, organ damage, bleeding requiring transfusion of blood products with HIV and hepatitis acquisition, DVT, PE, pneumonia, laparotomy, pelvic pain, painful intercourse.  Symptoms of menopause were also reviewed preoperatively. All questions were answered and consent is signed on the chart.  FINDINGS:  Upper abdomen appears normal.  In the pelvis, she is status post hysterectomy.  There is a single, filmy adhesion of the appendiceal epiploicae to the left vesicouterine fold.  Right ovary has a firm quality to it, but the surface appears normal.  There were no excrescences or adhesions.  There is no ascites.  The left tube and ovary appears normal as well.  PROCEDURE IN DETAIL:  The patient was taken to the operating room, where she was identified, placed in dorsal supine position.  General anesthesia was induced via  endotracheal intubation.  She was then placed in dorsal lithotomy position.  Time-out was undertaken.  She was prepped abdominally and vaginally.  Bladder straight catheterized, and she was draped in sterile fashion.  The infraumbilical and suprapubic areas were injected in midline with approximately 8 mL of 0.5% plain Marcaine.  An infraumbilical incision was made through the old scar.  Dissection was carried out in layers to the anterior fascia which was grasped under good visualization with Kocher clamps.  The fascia was then taken down with scissors for a width of approximately 1.5 cm.  Then under good visualization, a purse-string of 0 Vicryl was placed around the anterior fascia and held for future use.  The posterior leaf of the fascia and the peritoneum were then bluntly perforated with finger without difficulty.  The disposable Hasson trocar sleeve was then placed.  The balloon was inflated.  Pneumoperitoneum was created an inspection with the operative laparoscope revealed the above.  The small suprapubic incision was made in the midline and a 5-mm disposable XL bladeless trocar sleeve was then placed under direct visualization without difficulty.  Irrigation of the pelvis was then carried out.  The fluid was aspirated and sent for cytology.  The right tube and ovary were elevated and under good visualization, well clear of the bowel.  The EnSeal bipolar cautery cutting instrument was used  to take down the infundibulopelvic ligament and the remaining pedicle of the ovary.  The ovary was then placed in the pelvis.  Then using the 5 mm laparoscope through the suprapubic trocar sleeve, the EndoCatch was placed through the umbilical trocar sleeve, and the tube and ovary were retrieved without difficulty.  The umbilical trocar sleeve was then replaced and a similar procedure was carried out on the left tube and ovary.  Again good hemostasis was noted.  The left ovary was placed in the  cul-de-sac and then using the 5 mm laparoscope, it is also retrieved with a second EndoCatch without difficulty through the umbilical incision.  After placing umbilical trocar sleeve again, careful inspection was carried out.  Both pedicles were completely dried.  The remaining approximately 23 mL of 0.5% plain Marcaine was then instilled into the pelvis.  The suprapubic trocar sleeve was removed and good hemostasis was noted there.  The umbilical trocar sleeve was removed and the pneumoperitoneum was completely reduced.  The pursestring 0 Vicryl suture was then tied down giving good closure to the fascia under good visualization.  The skin at the umbilical incision was closed with a subcuticular 4-0 Vicryl and Dermabond was placed on both incisions as well.  All counts correct. The patient was awake and taken to recovery room in stable condition.     Guy Sandifer Henderson Cloud, M.D.     JET/MEDQ  D:  02/10/2013  T:  02/10/2013  Job:  [id number]

## 2013-02-10 NOTE — Anesthesia Preprocedure Evaluation (Signed)
Anesthesia Evaluation  Patient identified by MRN, date of birth, ID band Patient awake    Reviewed: Allergy & Precautions, H&P , NPO status , Patient's Chart, lab work & pertinent test results, reviewed documented beta blocker date and time   Airway Mallampati: II TM Distance: >3 FB Neck ROM: full    Dental no notable dental hx. (+) Teeth Intact   Pulmonary neg pulmonary ROS,    Pulmonary exam normal       Cardiovascular hypertension, Pt. on medications and Pt. on home beta blockers negative cardio ROS      Neuro/Psych negative psych ROS   GI/Hepatic Neg liver ROS, GERD-  Medicated and Controlled,  Endo/Other  Morbid obesity  Renal/GU negative Renal ROS  negative genitourinary   Musculoskeletal negative musculoskeletal ROS (+)   Abdominal (+) + obese,   Peds negative pediatric ROS (+)  Hematology negative hematology ROS (+)   Anesthesia Other Findings   Reproductive/Obstetrics negative OB ROS                           Anesthesia Physical Anesthesia Plan  ASA: III  Anesthesia Plan: General   Post-op Pain Management:    Induction: Intravenous  Airway Management Planned: Oral ETT  Additional Equipment:   Intra-op Plan:   Post-operative Plan: Extubation in OR  Informed Consent: I have reviewed the patients History and Physical, chart, labs and discussed the procedure including the risks, benefits and alternatives for the proposed anesthesia with the patient or authorized representative who has indicated his/her understanding and acceptance.   Dental Advisory Given  Plan Discussed with: CRNA and Surgeon  Anesthesia Plan Comments:         Anesthesia Quick Evaluation

## 2013-02-10 NOTE — Anesthesia Postprocedure Evaluation (Signed)
Anesthesia Post Note  Patient: Samantha Clark  Procedure(s) Performed: Procedure(s) (LRB): LAPAROSCOPY OPERATIVE (N/A) BILATERAL SALPINGECTOMY with peritoneal washings (N/A)  Anesthesia type: General  Patient location: PACU  Post pain: Pain level controlled  Post assessment: Post-op Vital signs reviewed  Last Vitals:  Filed Vitals:   02/10/13 0915  BP: 133/76  Pulse: 63  Temp:   Resp: 16    Post vital signs: Reviewed  Level of consciousness: sedated  Complications: No apparent anesthesia complications

## 2013-02-10 NOTE — Progress Notes (Signed)
No changes to H&P per patient history Reviewed procedure with patient for L/S BSO

## 2013-02-10 NOTE — Brief Op Note (Signed)
02/10/2013  8:29 AM  PATIENT:  Samantha Clark  47 y.o. female  PRE-OPERATIVE DIAGNOSIS:  right ovarian cyst cpt [id number]  POST-OPERATIVE DIAGNOSIS:  right ovarian cyst cpt 62952  PROCEDURE:  Procedure(s): LAPAROSCOPY OPERATIVE (N/A) BILATERAL SALPINGECTOMY with peritoneal washings (N/A)  SURGEON:  Surgeon(s) and Role:    * Leslie Andrea, MD - Primary  PHYSICIAN ASSISTANT:   ASSISTANTS: none   ANESTHESIA:   general  EBL:  Total I/O In: 1000 [I.V.:1000] Out: 100 [Urine:50; Blood:50]  BLOOD ADMINISTERED:none  DRAINS: none   LOCAL MEDICATIONS USED:  MARCAINE     SPECIMEN:  Source of Specimen:  bilateral ovaries and tubes  DISPOSITION OF SPECIMEN:  PATHOLOGY  COUNTS:  YES  TOURNIQUET:  * No tourniquets in log *  DICTATION: .Other Dictation: Dictation Number [id number]  PLAN OF CARE: Discharge to home after PACU  PATIENT DISPOSITION:  PACU - hemodynamically stable.   Delay start of Pharmacological VTE agent (>24hrs) due to surgical blood loss or risk of bleeding: not applicable

## 2013-02-11 ENCOUNTER — Encounter (HOSPITAL_COMMUNITY): Payer: Self-pay | Admitting: Obstetrics and Gynecology

## 2013-02-11 MED FILL — Heparin Sodium (Porcine) Inj 5000 Unit/ML: INTRAMUSCULAR | Qty: 1 | Status: AC

## 2014-01-11 ENCOUNTER — Other Ambulatory Visit: Payer: Self-pay | Admitting: Family Medicine

## 2014-01-11 DIAGNOSIS — R1011 Right upper quadrant pain: Secondary | ICD-10-CM

## 2014-01-14 ENCOUNTER — Ambulatory Visit
Admission: RE | Admit: 2014-01-14 | Discharge: 2014-01-14 | Disposition: A | Discharge status: Home / Self Care | Payer: 59 | Source: Ambulatory Visit | Attending: Family Medicine | Admitting: Family Medicine

## 2014-01-14 DIAGNOSIS — R1011 Right upper quadrant pain: Secondary | ICD-10-CM

## 2014-04-09 ENCOUNTER — Other Ambulatory Visit: Payer: Self-pay | Admitting: Family Medicine

## 2014-04-09 DIAGNOSIS — M546 Pain in thoracic spine: Secondary | ICD-10-CM

## 2014-06-07 ENCOUNTER — Other Ambulatory Visit (HOSPITAL_COMMUNITY): Payer: Self-pay | Admitting: Sports Medicine

## 2014-06-07 DIAGNOSIS — M5440 Lumbago with sciatica, unspecified side: Secondary | ICD-10-CM

## 2014-06-18 ENCOUNTER — Ambulatory Visit (HOSPITAL_COMMUNITY): Payer: 59

## 2014-06-22 ENCOUNTER — Ambulatory Visit (HOSPITAL_COMMUNITY)
Admission: RE | Admit: 2014-06-22 | Discharge: 2014-06-22 | Disposition: A | Discharge status: Home / Self Care | Payer: 59 | Source: Ambulatory Visit | Attending: Sports Medicine | Admitting: Sports Medicine

## 2014-06-22 DIAGNOSIS — M5440 Lumbago with sciatica, unspecified side: Secondary | ICD-10-CM

## 2014-06-22 DIAGNOSIS — M4806 Spinal stenosis, lumbar region: Secondary | ICD-10-CM | POA: Diagnosis not present

## 2014-06-22 DIAGNOSIS — M5126 Other intervertebral disc displacement, lumbar region: Secondary | ICD-10-CM | POA: Diagnosis present

## 2014-06-22 DIAGNOSIS — M479 Spondylosis, unspecified: Secondary | ICD-10-CM | POA: Diagnosis not present

## 2014-06-22 DIAGNOSIS — M5441 Lumbago with sciatica, right side: Secondary | ICD-10-CM | POA: Diagnosis present

## 2014-08-09 ENCOUNTER — Emergency Department (HOSPITAL_COMMUNITY): Payer: 59

## 2014-08-09 ENCOUNTER — Emergency Department (HOSPITAL_COMMUNITY)
Admission: EM | Admit: 2014-08-09 | Discharge: 2014-08-09 | Disposition: A | Discharge status: Home / Self Care | Payer: 59 | Attending: Emergency Medicine | Admitting: Emergency Medicine

## 2014-08-09 ENCOUNTER — Encounter (HOSPITAL_COMMUNITY): Payer: Self-pay | Admitting: Emergency Medicine

## 2014-08-09 DIAGNOSIS — Z79899 Other long term (current) drug therapy: Secondary | ICD-10-CM | POA: Insufficient documentation

## 2014-08-09 DIAGNOSIS — I10 Essential (primary) hypertension: Secondary | ICD-10-CM | POA: Insufficient documentation

## 2014-08-09 DIAGNOSIS — R079 Chest pain, unspecified: Secondary | ICD-10-CM | POA: Insufficient documentation

## 2014-08-09 DIAGNOSIS — K219 Gastro-esophageal reflux disease without esophagitis: Secondary | ICD-10-CM | POA: Diagnosis not present

## 2014-08-09 LAB — HEPATIC FUNCTION PANEL
ALBUMIN: 4.1 g/dL (ref 3.5–5.2)
ALT: 10 U/L (ref 0–35)
AST: 17 U/L (ref 0–37)
Alkaline Phosphatase: 106 U/L (ref 39–117)
BILIRUBIN TOTAL: 0.4 mg/dL (ref 0.3–1.2)
Bilirubin, Direct: <0.2 mg/dL (ref 0.0–0.3)
Total Protein: 8.7 g/dL — ABNORMAL HIGH (ref 6.0–8.3)

## 2014-08-09 LAB — BASIC METABOLIC PANEL
Anion gap: 16 — ABNORMAL HIGH (ref 5–15)
BUN: 8 mg/dL (ref 6–23)
CHLORIDE: 96 meq/L (ref 96–112)
CO2: 25 meq/L (ref 19–32)
CREATININE: 0.79 mg/dL (ref 0.50–1.10)
Calcium: 10 mg/dL (ref 8.4–10.5)
GFR calc Af Amer: >90 mL/min (ref >90)
GFR calc non Af Amer: >90 mL/min (ref >90)
GLUCOSE: 110 mg/dL — AB (ref 70–99)
POTASSIUM: 3.5 meq/L — AB (ref 3.7–5.3)
Sodium: 137 mEq/L (ref 137–147)

## 2014-08-09 LAB — CBC
HEMATOCRIT: 42.7 % (ref 36.0–46.0)
HEMOGLOBIN: 14.1 g/dL (ref 12.0–15.0)
MCH: 29.4 pg (ref 26.0–34.0)
MCHC: 33 g/dL (ref 30.0–36.0)
MCV: 89.1 fL (ref 78.0–100.0)
Platelets: 347 10*3/uL (ref 150–400)
RBC: 4.79 MIL/uL (ref 3.87–5.11)
RDW: 13.3 % (ref 11.5–15.5)
WBC: 16 10*3/uL — AB (ref 4.0–10.5)

## 2014-08-09 LAB — I-STAT TROPONIN, ED: TROPONIN I, POC: 0 ng/mL (ref 0.00–0.08)

## 2014-08-09 LAB — LIPASE, BLOOD: LIPASE: 24 U/L (ref 11–59)

## 2014-08-09 LAB — PRO B NATRIURETIC PEPTIDE: Pro B Natriuretic peptide (BNP): 89.4 pg/mL (ref 0–125)

## 2014-08-09 MED ORDER — GI COCKTAIL ~~LOC~~
30.0000 mL | Freq: Once | ORAL | Status: AC
  Administered 2014-08-09: 30 mL via ORAL
  Filled 2014-08-09: qty 30

## 2014-08-09 MED ORDER — ASPIRIN 81 MG PO CHEW
324.0000 mg | CHEWABLE_TABLET | Freq: Once | ORAL | Status: AC
  Administered 2014-08-09: 324 mg via ORAL
  Filled 2014-08-09: qty 4

## 2014-08-09 NOTE — ED Provider Notes (Signed)
CSN: [id number]     Arrival date & time 08/09/14  1121 History   First MD Initiated Contact with Patient 08/09/14 1149     Chief Complaint  Patient presents with  . Chest Pain     (Consider location/radiation/quality/duration/timing/severity/associated sxs/prior Treatment) HPI Comments: Patient presents to the emergency department with chief complaints of left-sided chest pain that started yesterday. States pain is worse with movement and worsened with deep breathing. She denies any injury. She denies any history of heart disease. Denies any history of DVT or PE. Cardiac risk factors include hypertension and family history as well as obesity. She does not smoke. She has tried taking some aspirin as well as OTC pain medication. She states that she has a history of GERD, and feels like she has a gas bubble in her chest. She denies any burning sensation in her throat. Denies any epigastric or abdominal pain in general. She denies any associated shortness of breath or symptoms worsen with exertion.  The history is provided by the patient. No language interpreter was used.    Past Medical History  Diagnosis Date  . GERD (gastroesophageal reflux disease)   . Hypertension   . Seasonal allergies   . Headache(784.0)     otc med prn   Past Surgical History  Procedure Laterality Date  . Abdominal hysterectomy    . Cesarean section      x 1  . Diagnostic laparoscopy      endometriosis and pelvic pain  . Laparoscopy N/A 02/10/2013    Procedure: LAPAROSCOPY OPERATIVE;  Surgeon: Allena Katz, MD;  Location: [facility];  Service: Gynecology;  Laterality: N/A;  . Bilateral salpingectomy N/A 02/10/2013    Procedure: BILATERAL SALPINGECTOMY with peritoneal washings;  Surgeon: Allena Katz, MD;  Location: [facility];  Service: Gynecology;  Laterality: N/A;   No family history on file. History  Substance Use Topics  . Smoking status: Never Smoker   . Smokeless tobacco: Never Used  . Alcohol  Use: No   OB History    No data available     Review of Systems  Constitutional: Negative for fever and chills.  Respiratory: Negative for shortness of breath.   Cardiovascular: Positive for chest pain.  Gastrointestinal: Negative for nausea, vomiting, diarrhea and constipation.  Genitourinary: Negative for dysuria.  All other systems reviewed and are negative.     Allergies  Ibuprofen  Home Medications   Prior to Admission medications   Medication Sig Start Date End Date Taking? Authorizing Provider  atenolol (TENORMIN) 25 MG tablet Take 25 mg by mouth daily.   Yes Historical Provider, MD  hydrochlorothiazide (HYDRODIURIL) 25 MG tablet Take 25 mg by mouth daily.   Yes Historical Provider, MD  omeprazole (PRILOSEC) 20 MG capsule Take 20 mg by mouth 2 (two) times daily as needed (heartburn).    Yes Historical Provider, MD  oxyCODONE-acetaminophen (PERCOCET/ROXICET) 5-325 MG per tablet Take 2 tablets by mouth every 6 (six) hours as needed for pain. Patient not taking: Reported on 08/09/2014 02/10/13   Shon Millet II, MD   BP 129/78 mmHg  Pulse 90  Temp(Src) 99 F (37.2 C) (Oral)  Resp 19  SpO2 96% Physical Exam  Constitutional: She is oriented to person, place, and time. She appears well-developed and well-nourished.  HENT:  Head: Normocephalic and atraumatic.  Eyes: Conjunctivae and EOM are normal. Pupils are equal, round, and reactive to light.  Neck: Normal range of motion. Neck supple.  Cardiovascular: Normal  rate and regular rhythm.  Exam reveals no gallop and no friction rub.   No murmur heard. Pulmonary/Chest: Effort normal and breath sounds normal. No respiratory distress. She has no wheezes. She has no rales. She exhibits no tenderness.  Abdominal: Soft. Bowel sounds are normal. She exhibits no distension and no mass. There is no tenderness. There is no rebound and no guarding.  Musculoskeletal: Normal range of motion. She exhibits no edema or tenderness.   Neurological: She is alert and oriented to person, place, and time.  Skin: Skin is warm and dry.  Psychiatric: She has a normal mood and affect. Her behavior is normal. Judgment and thought content normal.  Nursing note and vitals reviewed.   ED Course  Procedures (including critical care time) Results for orders placed or performed during the hospital encounter of 08/09/14  CBC  Result Value Ref Range   WBC 16.0 (H) 4.0 - 10.5 K/uL   RBC 4.79 3.87 - 5.11 MIL/uL   Hemoglobin 14.1 12.0 - 15.0 g/dL   HCT 42.7 36.0 - 46.0 %   MCV 89.1 78.0 - 100.0 fL   MCH 29.4 26.0 - 34.0 pg   MCHC 33.0 30.0 - 36.0 g/dL   RDW 13.3 11.5 - 15.5 %   Platelets 347 150 - 400 K/uL  Basic metabolic panel  Result Value Ref Range   Sodium 137 137 - 147 mEq/L   Potassium 3.5 (L) 3.7 - 5.3 mEq/L   Chloride 96 96 - 112 mEq/L   CO2 25 19 - 32 mEq/L   Glucose, Bld 110 (H) 70 - 99 mg/dL   BUN 8 6 - 23 mg/dL   Creatinine, Ser 0.79 0.50 - 1.10 mg/dL   Calcium 10.0 8.4 - 10.5 mg/dL   GFR calc non Af Amer >90 >90 mL/min   GFR calc Af Amer >90 >90 mL/min   Anion gap 16 (H) 5 - 15  BNP (order ONLY if patient complains of dyspnea/SOB AND you have documented it for THIS visit)  Result Value Ref Range   Pro B Natriuretic peptide (BNP) 89.4 0 - 125 pg/mL  Lipase, blood  Result Value Ref Range   Lipase 24 11 - 59 U/L  Hepatic function panel  Result Value Ref Range   Total Protein 8.7 (H) 6.0 - 8.3 g/dL   Albumin 4.1 3.5 - 5.2 g/dL   AST 17 0 - 37 U/L   ALT 10 0 - 35 U/L   Alkaline Phosphatase 106 39 - 117 U/L   Total Bilirubin 0.4 0.3 - 1.2 mg/dL   Bilirubin, Direct <0.2 0.0 - 0.3 mg/dL   Indirect Bilirubin NOT CALCULATED 0.3 - 0.9 mg/dL  I-stat troponin, ED (not at [facility])  Result Value Ref Range   Troponin i, poc 0.00 0.00 - 0.08 ng/mL   Comment 3           Dg Chest 2 View  08/09/2014   CLINICAL DATA:  Chest pain  EXAM: CHEST  2 VIEW  COMPARISON:  11/25/2008  FINDINGS: Mild cardiac enlargement without  heart failure. Lungs are clear of infiltrate effusion or mass. No change from the prior study.  IMPRESSION: No active cardiopulmonary disease.   Electronically Signed   By: Franchot Gallo M.D.   On: 08/09/2014 11:55     Imaging Review Dg Chest 2 View  08/09/2014   CLINICAL DATA:  Chest pain  EXAM: CHEST  2 VIEW  COMPARISON:  11/25/2008  FINDINGS: Mild cardiac enlargement without heart failure. Lungs  are clear of infiltrate effusion or mass. No change from the prior study.  IMPRESSION: No active cardiopulmonary disease.   Electronically Signed   By: Franchot Gallo M.D.   On: 08/09/2014 11:55     EKG Interpretation   Date/Time:  Monday August 09 2014 11:30:19 EST Ventricular Rate:  88 PR Interval:  166 QRS Duration: 96 QT Interval:  383 QTC Calculation: 463 R Axis:   44 Text Interpretation:  Sinus rhythm Probable left atrial enlargement  Borderline T abnormalities, anterior leads agree. no change Confirmed by  Johnney Killian, MD, Jeannie Done [id number]) on 08/09/2014 12:29:27 PM      MDM   Final diagnoses:  Chest pain, unspecified chest pain type     12:29 PM Patient discussed with Dr. Johnney Killian who recommends adding on LFTs and Lipase.  Patient feels much improved after GI cocktail. She states that she had belched a couple of times relieving a lot of pressure.  She states that she is currently pain free.  HEART score is 2.  PERC negative.  Patient seen by and discussed with Dr. Johnney Killian, who agrees with plan for discharge.  Recommend outpatient stress test.   Montine Circle, PA-C 08/09/14 [place], MD 08/10/14 [phone]

## 2014-08-09 NOTE — ED Notes (Signed)
Pt c/o left sided chest pain tat started yesterday, states the pain moves around. Pt c/o SOB due to hurting while taking deep breath. Denies n/v.

## 2014-08-09 NOTE — Discharge Instructions (Signed)

## 2016-01-19 DIAGNOSIS — R110 Nausea: Secondary | ICD-10-CM | POA: Diagnosis not present

## 2016-01-19 DIAGNOSIS — R1011 Right upper quadrant pain: Secondary | ICD-10-CM | POA: Diagnosis not present

## 2016-01-19 MED FILL — GABAPENTIN 300 MG CAPSULE: 300 | 30 days supply | Qty: 90 | Fill #0

## 2016-01-19 MED FILL — ONDANSETRON ODT 4 MG TABLET: 4 | 30 days supply | Qty: 90 | Fill #0

## 2016-02-23 ENCOUNTER — Other Ambulatory Visit (HOSPITAL_COMMUNITY): Payer: Self-pay | Admitting: Gastroenterology

## 2016-02-23 DIAGNOSIS — R1011 Right upper quadrant pain: Secondary | ICD-10-CM | POA: Diagnosis not present

## 2016-02-23 DIAGNOSIS — Z1210 Encounter for screening for malignant neoplasm of intestinal tract, unspecified: Secondary | ICD-10-CM | POA: Diagnosis not present

## 2016-02-23 DIAGNOSIS — R110 Nausea: Secondary | ICD-10-CM | POA: Diagnosis not present

## 2016-02-23 DIAGNOSIS — K219 Gastro-esophageal reflux disease without esophagitis: Secondary | ICD-10-CM | POA: Diagnosis not present

## 2016-02-23 DIAGNOSIS — K5900 Constipation, unspecified: Secondary | ICD-10-CM | POA: Diagnosis not present

## 2016-02-23 DIAGNOSIS — R109 Unspecified abdominal pain: Secondary | ICD-10-CM

## 2016-02-23 MED FILL — PANTOPRAZOLE SOD DR 40 MG T: 40 | 90 days supply | Qty: 90 | Fill #0

## 2016-03-01 ENCOUNTER — Ambulatory Visit (HOSPITAL_COMMUNITY)
Admission: RE | Admit: 2016-03-01 | Discharge: 2016-03-01 | Disposition: A | Discharge status: Home / Self Care | Payer: 59 | Source: Ambulatory Visit | Attending: Gastroenterology | Admitting: Gastroenterology

## 2016-03-01 DIAGNOSIS — R110 Nausea: Secondary | ICD-10-CM | POA: Diagnosis not present

## 2016-03-01 DIAGNOSIS — R109 Unspecified abdominal pain: Secondary | ICD-10-CM

## 2016-03-01 DIAGNOSIS — R1011 Right upper quadrant pain: Secondary | ICD-10-CM | POA: Diagnosis not present

## 2016-03-01 MED ORDER — TECHNETIUM TC 99M MEBROFENIN IV KIT: 5.0000 | PACK | Freq: Once | INTRAVENOUS | Status: DC | PRN

## 2016-03-09 ENCOUNTER — Encounter (HOSPITAL_COMMUNITY): Payer: Self-pay | Admitting: *Deleted

## 2016-03-09 ENCOUNTER — Other Ambulatory Visit: Payer: Self-pay | Admitting: Gastroenterology

## 2016-03-13 MED FILL — GAVILYTE-N SOLUTION: 420 | 1 days supply | Qty: 4000 | Fill #0

## 2016-03-15 ENCOUNTER — Ambulatory Visit (HOSPITAL_COMMUNITY): Payer: 59 | Admitting: Anesthesiology

## 2016-03-15 ENCOUNTER — Encounter (HOSPITAL_COMMUNITY)
Admission: RE | Disposition: A | Discharge status: Home / Self Care | Payer: Self-pay | Source: Ambulatory Visit | Attending: Gastroenterology

## 2016-03-15 ENCOUNTER — Ambulatory Visit (HOSPITAL_COMMUNITY)
Admission: RE | Admit: 2016-03-15 | Discharge: 2016-03-15 | Disposition: A | Discharge status: Home / Self Care | Payer: 59 | Source: Ambulatory Visit | Attending: Gastroenterology | Admitting: Gastroenterology

## 2016-03-15 ENCOUNTER — Encounter (HOSPITAL_COMMUNITY): Payer: Self-pay

## 2016-03-15 DIAGNOSIS — Z79899 Other long term (current) drug therapy: Secondary | ICD-10-CM | POA: Diagnosis not present

## 2016-03-15 DIAGNOSIS — K2950 Unspecified chronic gastritis without bleeding: Secondary | ICD-10-CM | POA: Insufficient documentation

## 2016-03-15 DIAGNOSIS — K5730 Diverticulosis of large intestine without perforation or abscess without bleeding: Secondary | ICD-10-CM | POA: Diagnosis not present

## 2016-03-15 DIAGNOSIS — R112 Nausea with vomiting, unspecified: Secondary | ICD-10-CM | POA: Insufficient documentation

## 2016-03-15 DIAGNOSIS — K219 Gastro-esophageal reflux disease without esophagitis: Secondary | ICD-10-CM | POA: Diagnosis not present

## 2016-03-15 DIAGNOSIS — I10 Essential (primary) hypertension: Secondary | ICD-10-CM | POA: Insufficient documentation

## 2016-03-15 DIAGNOSIS — Z1211 Encounter for screening for malignant neoplasm of colon: Secondary | ICD-10-CM | POA: Diagnosis not present

## 2016-03-15 DIAGNOSIS — K5900 Constipation, unspecified: Secondary | ICD-10-CM | POA: Insufficient documentation

## 2016-03-15 DIAGNOSIS — R1011 Right upper quadrant pain: Secondary | ICD-10-CM | POA: Diagnosis not present

## 2016-03-15 HISTORY — DX: Adverse effect of unspecified anesthetic, initial encounter: T41.45XA

## 2016-03-15 HISTORY — DX: Other complications of anesthesia, initial encounter: T88.59XA

## 2016-03-15 HISTORY — DX: Sciatica, unspecified side: M54.30

## 2016-03-15 HISTORY — PX: COLONOSCOPY: SHX5424

## 2016-03-15 HISTORY — PX: ESOPHAGOGASTRODUODENOSCOPY: SHX5428

## 2016-03-15 HISTORY — DX: Other intervertebral disc displacement, lumbar region: M51.26

## 2016-03-15 SURGERY — COLONOSCOPY
Anesthesia: Monitor Anesthesia Care

## 2016-03-15 MED ORDER — SODIUM CHLORIDE 0.9 % IV SOLN: INTRAVENOUS | Status: DC

## 2016-03-15 MED ORDER — LIDOCAINE HCL (CARDIAC) 20 MG/ML IV SOLN
INTRAVENOUS | Status: DC | PRN
  Administered 2016-03-15: 50 mg via INTRAVENOUS

## 2016-03-15 MED ORDER — PROPOFOL 10 MG/ML IV BOLUS
INTRAVENOUS | Status: AC
Start: 2016-03-15 — End: 2016-03-15
  Filled 2016-03-15: qty 40

## 2016-03-15 MED ORDER — PROPOFOL 500 MG/50ML IV EMUL
INTRAVENOUS | Status: DC | PRN
  Administered 2016-03-15: 125 ug/kg/min via INTRAVENOUS

## 2016-03-15 MED ORDER — LACTATED RINGERS IV SOLN
INTRAVENOUS | Status: DC
  Administered 2016-03-15: 1000 mL via INTRAVENOUS

## 2016-03-15 MED ORDER — PROPOFOL 500 MG/50ML IV EMUL
INTRAVENOUS | Status: DC | PRN
  Administered 2016-03-15: 30 mg via INTRAVENOUS

## 2016-03-15 MED ORDER — PROPOFOL 10 MG/ML IV BOLUS
INTRAVENOUS | Status: AC
Start: 2016-03-15 — End: 2016-03-15
  Filled 2016-03-15: qty 20

## 2016-03-15 NOTE — Transfer of Care (Signed)
Immediate Anesthesia Transfer of Care Note  Patient: Samantha Clark  Procedure(s) Performed: Procedure(s): COLONOSCOPY (N/A) ESOPHAGOGASTRODUODENOSCOPY (EGD) (N/A)  Patient Location: PACU  Anesthesia Type:MAC  Level of Consciousness: awake, alert  and oriented  Airway & Oxygen Therapy: Patient Spontanous Breathing and Patient connected to face mask oxygen  Post-op Assessment: Report given to RN and Post -op Vital signs reviewed and stable  Post vital signs: Reviewed and stable  Last Vitals:  Filed Vitals:   03/15/16 0732  BP: 140/82  Pulse: 62  Temp: 36.9 C  Resp: 13    Last Pain: There were no vitals filed for this visit.       Complications: No apparent anesthesia complications

## 2016-03-15 NOTE — Anesthesia Postprocedure Evaluation (Signed)
Anesthesia Post Note  Patient: MEHER PAULLIN  Procedure(s) Performed: Procedure(s) (LRB): COLONOSCOPY (N/A) ESOPHAGOGASTRODUODENOSCOPY (EGD) (N/A)  Patient location during evaluation: PACU Anesthesia Type: MAC Level of consciousness: awake and alert Pain management: pain level controlled Vital Signs Assessment: post-procedure vital signs reviewed and stable Respiratory status: spontaneous breathing, nonlabored ventilation, respiratory function stable and patient connected to nasal cannula oxygen Cardiovascular status: stable and blood pressure returned to baseline Anesthetic complications: no    Last Vitals:  Filed Vitals:   03/15/16 0950 03/15/16 1000  BP: 162/104 145/80  Pulse: 63 60  Temp:    Resp: 11 14    Last Pain: There were no vitals filed for this visit.               Effie Berkshire

## 2016-03-15 NOTE — Progress Notes (Signed)
Samantha Clark 8:37 AM  Subjective: Patient without any new symptoms since we saw her recently in the office but still with nausea and right upper quadrant pain and we reviewed her HIDA scan and lab work and ultrasound  Objective: Vital signs stable afebrile no acute distress exam please see preassessment evaluation  Assessment: Pain nausea reflux due for colonic screening  Plan: Okay to proceed with colonoscopy and endoscopy this morning with anesthesia assistance  [facility]  Pager [phone] After 5PM or if no answer call [phone]

## 2016-03-15 NOTE — Op Note (Signed)
[facility] Patient Name: Samantha Clark Procedure Date: 03/15/2016 MRN: [id number] Attending MD: Clarene Essex , MD Date of Birth: 26-Jul-1966 CSN: [id number] Age: 50 Admit Type: Outpatient Procedure:                Upper GI endoscopy Indications:              Abdominal pain in the right upper quadrant, For                            therapy of esophageal reflux, Nausea Providers:                Clarene Essex, MD, Cleda Daub, RN, [facility], Technician, Herbie Drape, CRNA Referring MD:              Medicines:                Propofol per Anesthesia Complications:            Hypoxia Estimated Blood Loss:     Estimated blood loss: none. Estimated blood loss:                            none. Procedure:                Pre-Anesthesia Assessment:                           - Prior to the procedure, a History and Physical                            was performed, and patient medications and                            allergies were reviewed. The patient's tolerance of                            previous anesthesia was also reviewed. The risks                            and benefits of the procedure and the sedation                            options and risks were discussed with the patient.                            All questions were answered, and informed consent                            was obtained. Prior Anticoagulants: The patient has                            taken no previous anticoagulant or antiplatelet  agents. ASA Grade Assessment: II - A patient with                            mild systemic disease. After reviewing the risks                            and benefits, the patient was deemed in                            satisfactory condition to undergo the procedure.                           After obtaining informed consent, the endoscope was                            passed under direct vision.  Throughout the                            procedure, the patient's blood pressure, pulse, and                            oxygen saturations were monitored continuously. The                            [id number]) scope was introduced through the                            mouth, and advanced to the third part of duodenum.                            The upper GI endoscopy was accomplished without                            difficulty. The patient tolerated the procedure. Scope In: Scope Out: Findings:      The larynx was normal.      The examined esophagus was normal.      Localized mild inflammation characterized by erythema was found in the       gastric antrum.      The duodenal bulb, first portion of the duodenum, second portion of the       duodenum and third portion of the duodenum were normal.      The exam was otherwise without abnormality. Impression:               - Normal larynx.                           - Normal esophagus.                           - Chronic gastritis.                           - Normal duodenal bulb, first portion of the  duodenum, second portion of the duodenum and third                            portion of the duodenum.                           - The examination was otherwise normal.                           - No specimens collected. Moderate Sedation:      N/A- Per Anesthesia Care Recommendation:           - Patient has a contact number available for                            emergencies. The signs and symptoms of potential                            delayed complications were discussed with the                            patient. Return to normal activities tomorrow.                            Written discharge instructions were provided to the                            patient.                           - Soft diet today.                           - Continue present medications.                           -  Return to GI clinic PRN.                           - Telephone GI clinic if symptomatic PRN. Procedure Code(s):        --- Professional ---                           [phone], Esophagogastroduodenoscopy, flexible,                            transoral; diagnostic, including collection of                            specimen(s) by brushing or washing, when performed                            (separate procedure) Diagnosis Code(s):        --- Professional ---                           K29.50, Unspecified chronic gastritis without  bleeding                           R10.11, Right upper quadrant pain                           K21.9, Gastro-esophageal reflux disease without                            esophagitis                           R11.0, Nausea CPT copyright 2016 [employer]. All rights reserved. The codes documented in this report are preliminary and upon coder review may  be revised to meet current compliance requirements. Clarene Essex, MD 03/15/2016 9:33:24 AM This report has been signed electronically. Number of Addenda: 0

## 2016-03-15 NOTE — Discharge Instructions (Addendum)
Gastrointestinal Endoscopy, Care After °Refer to this sheet in the next few weeks. These instructions provide you with information on caring for yourself after your procedure. Your caregiver may also give you more specific instructions. Your treatment has been planned according to current medical practices, but problems sometimes occur. Call your caregiver if you have any problems or questions after your procedure. °HOME CARE INSTRUCTIONS °· If you were given medicine to help you relax (sedative), do not drive, operate machinery, or sign important documents for 24 hours. °· Avoid alcohol and hot or warm beverages for the first 24 hours after the procedure. °· Only take over-the-counter or prescription medicines for pain, discomfort, or fever as directed by your caregiver. You may resume taking your normal medicines unless your caregiver tells you otherwise. Ask your caregiver when you may resume taking medicines that may cause bleeding, such as aspirin, clopidogrel, or warfarin. °· You may return to your normal diet and activities on the day after your procedure, or as directed by your caregiver. Walking may help to reduce any bloated feeling in your abdomen. °· Drink enough fluids to keep your urine clear or pale yellow. °· You may gargle with salt water if you have a sore throat. °SEEK IMMEDIATE MEDICAL CARE IF: °· You have severe nausea or vomiting. °· You have severe abdominal pain, abdominal cramps that last longer than 6 hours, or abdominal swelling (distention). °· You have severe shoulder or back pain. °· You have trouble swallowing. °· You have shortness of breath, your breathing is shallow, or you are breathing faster than normal. °· You have a fever or a rapid heartbeat. °· You vomit blood or material that looks like coffee grounds. °· You have bloody, black, or tarry stools. °MAKE SURE YOU: °· Understand these instructions. °· Will watch your condition. °· Will get help right away if you are not doing  well or get worse. °  °This information is not intended to replace advice given to you by your health care provider. Make sure you discuss any questions you have with your health care provider. °  °Document Released: 03/27/2004 Document Revised: 09/03/2014 Document Reviewed: 11/13/2011 °Elsevier Interactive Patient Education ©2016 [employer] ° ° °Colonoscopy, Care After °Refer to this sheet in the next few weeks. These instructions provide you with information on caring for yourself after your procedure. Your health care provider may also give you more specific instructions. Your treatment has been planned according to current medical practices, but problems sometimes occur. Call your health care provider if you have any problems or questions after your procedure. °WHAT TO EXPECT AFTER THE PROCEDURE  °After your procedure, it is typical to have the following: °· A small amount of blood in your stool. °· Moderate amounts of gas and mild abdominal cramping or bloating. °HOME CARE INSTRUCTIONS °· Do not drive, operate machinery, or sign important documents for 24 hours. °· You may shower and resume your regular physical activities, but move at a slower pace for the first 24 hours. °· Take frequent rest periods for the first 24 hours. °· Walk around or put a warm pack on your abdomen to help reduce abdominal cramping and bloating. °· Drink enough fluids to keep your urine clear or pale yellow. °· You may resume your normal diet as instructed by your health care provider. Avoid heavy or fried foods that are hard to digest. °· Avoid drinking alcohol for 24 hours or as instructed by your health care provider. °· Only take over-the-counter   prescription medicines as directed by your health care provider.  If a tissue sample (biopsy) was taken during your procedure:  Do not take aspirin or blood thinners for 7 days, or as instructed by your health care provider.  Do not drink alcohol for 7 days, or as instructed by  your health care provider.  Eat soft foods for the first 24 hours. SEEK MEDICAL CARE IF: You have persistent spotting of blood in your stool 2-3 days after the procedure. SEEK IMMEDIATE MEDICAL CARE IF:  You have more than a small spotting of blood in your stool.  You pass large blood clots in your stool.  Your abdomen is swollen (distended).  You have nausea or vomiting.  You have a fever.  You have increasing abdominal pain that is not relieved with medicine.   This information is not intended to replace advice given to you by your health care provider. Make sure you discuss any questions you have with your health care provider.   Document Released: 03/27/2004 Document Revised: 06/03/2013 Document Reviewed: 04/20/2013 Elsevier Interactive Patient Education [employer].  Call if question or problem otherwise take Protonix twice a day and call with update in 2 weeks to decide follow-up or further workup and plans and repeat colon screening in 10 years

## 2016-03-15 NOTE — Anesthesia Preprocedure Evaluation (Addendum)
Anesthesia Evaluation  Patient identified by MRN, date of birth, ID band Patient awake    Reviewed: Allergy & Precautions, NPO status , Patient's Chart, lab work & pertinent test results, reviewed documented beta blocker date and time   Airway Mallampati: II       Dental  (+) Dental Advisory Given, Missing, Chipped   Pulmonary    breath sounds clear to auscultation       Cardiovascular hypertension, Pt. on medications and Pt. on home beta blockers  Rhythm:Regular Rate:Normal     Neuro/Psych  Headaches,  Neuromuscular disease negative psych ROS   GI/Hepatic Neg liver ROS, GERD  Medicated,  Endo/Other  negative endocrine ROS  Renal/GU negative Renal ROS  negative genitourinary   Musculoskeletal negative musculoskeletal ROS (+)   Abdominal   Peds negative pediatric ROS (+)  Hematology negative hematology ROS (+)   Anesthesia Other Findings   Reproductive/Obstetrics negative OB ROS                            2015 EKG: normal sinus rhythm.   Anesthesia Physical Anesthesia Plan  ASA: III  Anesthesia Plan: MAC   Post-op Pain Management:    Induction: Intravenous  Airway Management Planned: Natural Airway and Nasal Cannula  Additional Equipment:   Intra-op Plan:   Post-operative Plan:   Informed Consent: I have reviewed the patients History and Physical, chart, labs and discussed the procedure including the risks, benefits and alternatives for the proposed anesthesia with the patient or authorized representative who has indicated his/her understanding and acceptance.     Plan Discussed with: CRNA  Anesthesia Plan Comments:         Anesthesia Quick Evaluation

## 2016-03-15 NOTE — Op Note (Signed)
[facility] Patient Name: Samantha Clark Procedure Date: 03/15/2016 MRN: [id number] Attending MD: Clarene Essex , MD Date of Birth: [birth year] CSN: [id number] Age: 50 Admit Type: Outpatient Procedure:                Colonoscopy Indications:              Screening for colorectal malignant neoplasm, This                            is the patient's first colonoscopy Providers:                Clarene Essex, MD, Cleda Daub, RN, [facility], Technician, Herbie Drape, CRNA Referring MD:              Medicines:                Propofol per Anesthesia Complications:            No immediate complications. Estimated Blood Loss:     Estimated blood loss: none. Procedure:                Pre-Anesthesia Assessment:                           - Prior to the procedure, a History and Physical                            was performed, and patient medications and                            allergies were reviewed. The patient's tolerance of                            previous anesthesia was also reviewed. The risks                            and benefits of the procedure and the sedation                            options and risks were discussed with the patient.                            All questions were answered, and informed consent                            was obtained. Prior Anticoagulants: The patient has                            taken no previous anticoagulant or antiplatelet                            agents. ASA Grade Assessment: II - A patient with  mild systemic disease. After reviewing the risks                            and benefits, the patient was deemed in                            satisfactory condition to undergo the procedure.                           After obtaining informed consent, the colonoscope                            was passed under direct vision. Throughout the   procedure, the patient's blood pressure, pulse, and                            oxygen saturations were monitored continuously. The                            [id number] scope was introduced through                            the anus and advanced to the the terminal ileum.                            The terminal ileum, ileocecal valve, appendiceal                            orifice, and rectum were photographed. The                            colonoscopy was performed without difficulty. The                            patient tolerated the procedure well. The quality                            of the bowel preparation was adequate. Scope In: 9:04:45 AM Scope Out: 9:17:40 AM Scope Withdrawal Time: 0 hours 9 minutes 46 seconds  Total Procedure Duration: 0 hours 12 minutes 55 seconds  Findings:      A few small-mouthed diverticula were found in the sigmoid colon.      The terminal ileum appeared normal.      The exam was otherwise without abnormality. Impression:               - Diverticulosis in the sigmoid colon.                           - The examined portion of the ileum was normal.                           - The examination was otherwise normal.                           - No specimens collected.  Moderate Sedation:      N/A- Per Anesthesia Care Recommendation:           - Patient has a contact number available for                            emergencies. The signs and symptoms of potential                            delayed complications were discussed with the                            patient. Return to normal activities tomorrow.                            Written discharge instructions were provided to the                            patient.                           - Soft diet today.                           - Continue present medications.                           - Repeat colonoscopy in 10 years for screening                            purposes.                            - Return to GI clinic PRN.                           - Telephone GI clinic if symptomatic PRN. Procedure Code(s):        --- Professional ---                           [phone], Colonoscopy, flexible; diagnostic, including                            collection of specimen(s) by brushing or washing,                            when performed (separate procedure) Diagnosis Code(s):        --- Professional ---                           Z12.11, Encounter for screening for malignant                            neoplasm of colon                           K57.30, Diverticulosis of large intestine without  perforation or abscess without bleeding CPT copyright 2016 American Medical Association. All rights reserved. The codes documented in this report are preliminary and upon coder review may  be revised to meet current compliance requirements. Clarene Essex, MD 03/15/2016 9:28:54 AM This report has been signed electronically. Number of Addenda: 0

## 2016-03-18 ENCOUNTER — Encounter (HOSPITAL_COMMUNITY): Payer: Self-pay | Admitting: Gastroenterology

## 2016-12-26 DIAGNOSIS — N390 Urinary tract infection, site not specified: Secondary | ICD-10-CM | POA: Diagnosis not present

## 2016-12-26 MED FILL — CIPROFLOXACIN HCL 500 MG TA: 500 | 5 days supply | Qty: 10 | Fill #0

## 2017-02-13 ENCOUNTER — Other Ambulatory Visit: Payer: Self-pay | Admitting: Family Medicine

## 2017-02-13 ENCOUNTER — Ambulatory Visit
Admission: RE | Admit: 2017-02-13 | Discharge: 2017-02-13 | Disposition: A | Discharge status: Home / Self Care | Payer: 59 | Source: Ambulatory Visit | Attending: Family Medicine | Admitting: Family Medicine

## 2017-02-13 DIAGNOSIS — G588 Other specified mononeuropathies: Secondary | ICD-10-CM | POA: Diagnosis not present

## 2017-02-13 DIAGNOSIS — R52 Pain, unspecified: Secondary | ICD-10-CM

## 2017-02-13 DIAGNOSIS — M546 Pain in thoracic spine: Secondary | ICD-10-CM | POA: Diagnosis not present

## 2017-02-13 DIAGNOSIS — L309 Dermatitis, unspecified: Secondary | ICD-10-CM | POA: Diagnosis not present

## 2017-02-13 DIAGNOSIS — M47814 Spondylosis without myelopathy or radiculopathy, thoracic region: Secondary | ICD-10-CM | POA: Diagnosis not present

## 2017-02-13 MED FILL — CLOTRIMAZOLE-BETAMETHASONE: 1-0.05 | 20 days supply | Qty: 60 | Fill #0

## 2017-03-27 ENCOUNTER — Emergency Department (HOSPITAL_COMMUNITY): Payer: 59

## 2017-03-27 ENCOUNTER — Encounter (HOSPITAL_COMMUNITY): Payer: Self-pay | Admitting: Emergency Medicine

## 2017-03-27 DIAGNOSIS — R079 Chest pain, unspecified: Secondary | ICD-10-CM | POA: Diagnosis not present

## 2017-03-27 DIAGNOSIS — I10 Essential (primary) hypertension: Secondary | ICD-10-CM | POA: Insufficient documentation

## 2017-03-27 DIAGNOSIS — M549 Dorsalgia, unspecified: Secondary | ICD-10-CM | POA: Diagnosis not present

## 2017-03-27 DIAGNOSIS — R1110 Vomiting, unspecified: Secondary | ICD-10-CM | POA: Insufficient documentation

## 2017-03-27 DIAGNOSIS — M545 Low back pain: Secondary | ICD-10-CM | POA: Diagnosis not present

## 2017-03-27 DIAGNOSIS — Z79899 Other long term (current) drug therapy: Secondary | ICD-10-CM | POA: Insufficient documentation

## 2017-03-27 DIAGNOSIS — G8929 Other chronic pain: Secondary | ICD-10-CM | POA: Insufficient documentation

## 2017-03-27 DIAGNOSIS — R1011 Right upper quadrant pain: Secondary | ICD-10-CM | POA: Diagnosis not present

## 2017-03-27 LAB — BASIC METABOLIC PANEL
Anion gap: 12 (ref 5–15)
BUN: 10 mg/dL (ref 6–20)
CALCIUM: 9.7 mg/dL (ref 8.9–10.3)
CO2: 25 mmol/L (ref 22–32)
Chloride: 101 mmol/L (ref 101–111)
Creatinine, Ser: 0.8 mg/dL (ref 0.44–1.00)
GFR calc Af Amer: >60 mL/min (ref >60)
GLUCOSE: 97 mg/dL (ref 65–99)
Potassium: 3.2 mmol/L — ABNORMAL LOW (ref 3.5–5.1)
Sodium: 138 mmol/L (ref 135–145)

## 2017-03-27 LAB — I-STAT TROPONIN, ED: TROPONIN I, POC: 0 ng/mL (ref 0.00–0.08)

## 2017-03-27 LAB — CBC
HEMATOCRIT: 40.6 % (ref 36.0–46.0)
Hemoglobin: 13.4 g/dL (ref 12.0–15.0)
MCH: 28.9 pg (ref 26.0–34.0)
MCHC: 33 g/dL (ref 30.0–36.0)
MCV: 87.5 fL (ref 78.0–100.0)
Platelets: 315 10*3/uL (ref 150–400)
RBC: 4.64 MIL/uL (ref 3.87–5.11)
RDW: 13.2 % (ref 11.5–15.5)
WBC: 13.8 10*3/uL — ABNORMAL HIGH (ref 4.0–10.5)

## 2017-03-27 NOTE — ED Notes (Addendum)
Pt presents to ED for assessment of chronic lower back pain, RUQ pain below her right breast, and generalized chest pressure, intermittent for "years" but patient states her lower back (bulging disc) is all that is being addressed by her PCP, and she is "tired of dealing with it, and not knowing what is wrong with me".  Pt c/o intermittent nausea, which she has a prescription for Zofran for.  Pt states pressure has no rhyme or reason.  Pt has had four episodes in the past week.

## 2017-03-28 ENCOUNTER — Emergency Department (HOSPITAL_COMMUNITY)
Admission: EM | Admit: 2017-03-28 | Discharge: 2017-03-28 | Disposition: A | Discharge status: Home / Self Care | Payer: 59 | Attending: Emergency Medicine | Admitting: Emergency Medicine

## 2017-03-28 ENCOUNTER — Emergency Department (HOSPITAL_COMMUNITY): Payer: 59

## 2017-03-28 DIAGNOSIS — K760 Fatty (change of) liver, not elsewhere classified: Secondary | ICD-10-CM | POA: Diagnosis not present

## 2017-03-28 DIAGNOSIS — R1110 Vomiting, unspecified: Secondary | ICD-10-CM | POA: Diagnosis not present

## 2017-03-28 DIAGNOSIS — Z79899 Other long term (current) drug therapy: Secondary | ICD-10-CM | POA: Diagnosis not present

## 2017-03-28 DIAGNOSIS — I10 Essential (primary) hypertension: Secondary | ICD-10-CM | POA: Diagnosis not present

## 2017-03-28 DIAGNOSIS — R1011 Right upper quadrant pain: Secondary | ICD-10-CM | POA: Diagnosis not present

## 2017-03-28 DIAGNOSIS — G8929 Other chronic pain: Secondary | ICD-10-CM | POA: Diagnosis not present

## 2017-03-28 DIAGNOSIS — M549 Dorsalgia, unspecified: Secondary | ICD-10-CM | POA: Diagnosis not present

## 2017-03-28 LAB — HEPATIC FUNCTION PANEL
ALT: 12 U/L — ABNORMAL LOW (ref 14–54)
AST: 21 U/L (ref 15–41)
Albumin: 4.1 g/dL (ref 3.5–5.0)
Alkaline Phosphatase: 72 U/L (ref 38–126)
Total Bilirubin: 0.4 mg/dL (ref 0.3–1.2)
Total Protein: 8.1 g/dL (ref 6.5–8.1)

## 2017-03-28 MED ORDER — POTASSIUM CHLORIDE CRYS ER 20 MEQ PO TBCR
40.0000 meq | EXTENDED_RELEASE_TABLET | Freq: Once | ORAL | Status: AC
  Administered 2017-03-28: 40 meq via ORAL
  Filled 2017-03-28: qty 2

## 2017-03-28 MED ORDER — PREDNISONE 20 MG PO TABS: 40.0000 mg | ORAL_TABLET | Freq: Every day | ORAL | 0 refills | Status: AC

## 2017-03-28 MED FILL — predniSONE 20 MG TABS: 20 | 5 days supply | Qty: 10 | Fill #0

## 2017-03-28 NOTE — ED Notes (Signed)
Patient transported to Ultrasound 

## 2017-03-28 NOTE — ED Provider Notes (Signed)
Samantha Clark DEPT Provider Note   CSN: [id number] Arrival date & time: 03/27/17  2106     History   Chief Complaint Chief Complaint  Patient presents with  . Chest Pain    HPI Samantha Clark is a 51 y.o. female.  Patient presents to the ED with a chief complaint of abdominal pain and back pain.  She states that she has had this pain intermittently for 4 years.  She reports pain in her RUQ of her abdomen that began yesterday and was worse than normal.  She denies fevers, chills, SOB.  She states that she did have some vomiting. She has not tried taking anything for her pain.  She reports that she is trying to get a referral to a spine specialist.    The history is provided by the patient. No language interpreter was used.    Past Medical History:  Diagnosis Date  . Bulging lumbar disc   . Complication of anesthesia    slow to awaken firsy surgery, none since  . GERD (gastroesophageal reflux disease)   . Headache(784.0)    otc med prn, hx migraines, none recent  . Hypertension   . Sciatic nerve pain   . Seasonal allergies     There are no active problems to display for this patient.   Past Surgical History:  Procedure Laterality Date  . ABDOMINAL HYSTERECTOMY  2007  . BILATERAL SALPINGECTOMY N/A 02/10/2013   Procedure: BILATERAL SALPINGECTOMY with peritoneal washings;  Surgeon: Allena Katz, MD;  Location: [facility];  Service: Gynecology;  Laterality: N/A;  . CESAREAN SECTION     x 1  . COLONOSCOPY N/A 03/15/2016   Procedure: COLONOSCOPY;  Surgeon: Clarene Essex, MD;  Location: [facility];  Service: Endoscopy;  Laterality: N/A;  . DIAGNOSTIC LAPAROSCOPY     endometriosis and pelvic pain  . ESOPHAGOGASTRODUODENOSCOPY N/A 03/15/2016   Procedure: ESOPHAGOGASTRODUODENOSCOPY (EGD);  Surgeon: Clarene Essex, MD;  Location: [facility];  Service: Endoscopy;  Laterality: N/A;  . LAPAROSCOPY N/A 02/10/2013   Procedure: LAPAROSCOPY OPERATIVE;  Surgeon: Allena Katz, MD;   Location: [facility];  Service: Gynecology;  Laterality: N/A;    OB History    No data available       Home Medications    Prior to Admission medications   Medication Sig Start Date End Date Taking? Authorizing Provider  omeprazole (PRILOSEC) 20 MG capsule Take 20 mg by mouth daily as needed (acid reflu).   Yes [provider]  ondansetron (ZOFRAN-ODT) 4 MG disintegrating tablet Take 4 mg by mouth every 8 (eight) hours as needed for nausea or vomiting.   Yes [provider]  pantoprazole (PROTONIX) 40 MG tablet Take 40 mg by mouth daily as needed (acid reflux).  02/23/16  Yes [provider]  predniSONE (DELTASONE) 20 MG tablet Take 2 tablets (40 mg total) by mouth daily. 03/28/17   Montine Circle, PA-C    Family History History reviewed. No pertinent family history.  Social History Social History  Substance Use Topics  . Smoking status: Never Smoker  . Smokeless tobacco: Never Used  . Alcohol use No     Allergies   Ibuprofen   Review of Systems Review of Systems  All other systems reviewed and are negative.    Physical Exam Updated Vital Signs BP (!) 147/60   Pulse 87   Temp 98.5 F (36.9 C) (Oral)   Resp (!) 22   SpO2 97%   Physical Exam  Constitutional:  She is oriented to person, place, and time. She appears well-developed and well-nourished.  HENT:  Head: Normocephalic and atraumatic.  Eyes: Pupils are equal, round, and reactive to light. Conjunctivae and EOM are normal.  Neck: Normal range of motion. Neck supple.  Cardiovascular: Normal rate and regular rhythm.  Exam reveals no gallop and no friction rub.   No murmur heard. Pulmonary/Chest: Effort normal and breath sounds normal. No respiratory distress. She has no wheezes. She has no rales. She exhibits no tenderness.  Abdominal: Soft. Bowel sounds are normal. She exhibits no distension and no mass. There is tenderness. There is no rebound and no guarding.  RUQ TTP    Musculoskeletal: Normal range of motion. She exhibits no edema or tenderness.  Neurological: She is alert and oriented to person, place, and time.  Skin: Skin is warm and dry.  Psychiatric: She has a normal mood and affect. Her behavior is normal. Judgment and thought content normal.  Nursing note and vitals reviewed.    ED Treatments / Results  Labs (all labs ordered are listed, but only abnormal results are displayed) Labs Reviewed  BASIC METABOLIC PANEL - Abnormal; Notable for the following:       Result Value   Potassium 3.2 (*)    All other components within normal limits  CBC - Abnormal; Notable for the following:    WBC 13.8 (*)    All other components within normal limits  HEPATIC FUNCTION PANEL - Abnormal; Notable for the following:    ALT 12 (*)    Bilirubin, Direct <0.1 (*)    All other components within normal limits  I-STAT TROPONIN, ED    EKG  EKG Interpretation  Date/Time:  Wednesday March 27 2017 21:11:19 EDT Ventricular Rate:  104 PR Interval:    QRS Duration: 86 QT Interval:  508 QTC Calculation: 668 R Axis:   59 Text Interpretation:  Sinus tachycardia with short PR Nonspecific T wave abnormality Prolonged QT Confirmed by Dory Horn) on 03/28/2017 1:44:19 AM       Radiology Dg Chest 2 View  Result Date: 03/27/2017 CLINICAL DATA:  Chest pain for 4 years. Pain under the right breast. History of hypertension. EXAM: CHEST  2 VIEW COMPARISON:  08/09/2014 FINDINGS: Mild cardiac enlargement. Pulmonary vascularity is normal. Lungs are clear and expanded. No blunting of costophrenic angles. No pneumothorax. Degenerative changes in the spine. IMPRESSION: Mild cardiac enlargement.  No evidence of active pulmonary disease. Electronically Signed   By: Lucienne Capers M.D.   On: 03/27/2017 21:50   US Abdomen Limited  Result Date: 03/28/2017 CLINICAL DATA:  51 year old female with right upper quadrant abdominal pain. EXAM: ULTRASOUND ABDOMEN LIMITED RIGHT  UPPER QUADRANT COMPARISON:  Abdominal CT dated 12/08/2012 FINDINGS: Gallbladder: No gallstones or wall thickening visualized. No sonographic Murphy sign noted by sonographer. Common bile duct: Diameter: 3 mm Liver: There is diffuse increased liver echogenicity consistent with fatty infiltration. Superimposed inflammation or fibrosis is not excluded. The main portal vein is patent with hepatopetal flow. IMPRESSION: 1. No gallstone. 2. Fatty liver. Electronically Signed   By: Anner Crete M.D.   On: 03/28/2017 03:25    Procedures Procedures (including critical care time)  Medications Ordered in ED Medications  potassium chloride SA (K-DUR,KLOR-CON) CR tablet 40 mEq (not administered)     Initial Impression / Assessment and Plan / ED Course  I have reviewed the triage vital signs and the nursing notes.  Pertinent labs & imaging results that were available during my  care of the patient were reviewed by me and considered in my medical decision making (see chart for details).    Patient with chronic back pain x 4 years.  No new changes.  Reports some increased pain in her RUQ yesterday with some vomiting. She states that she is sick of being in pain and wants to get an answer now.  Labs are reassuring.  Will check RUQ [place].  No gall stones.  Fatty liver.  Discussed this findings with the patient.  Recommend PCP follow-up.  Will also give referral to spine.  MDM Number of Diagnoses or Management Options Chronic back pain, unspecified back location, unspecified back pain laterality:  Right upper quadrant abdominal pain:   Coding  Final Clinical Impressions(s) / ED Diagnoses   Final diagnoses:  Chronic back pain, unspecified back location, unspecified back pain laterality  Right upper quadrant abdominal pain    New Prescriptions New Prescriptions   PREDNISONE (DELTASONE) 20 MG TABLET    Take 2 tablets (40 mg total) by mouth daily.     Montine Circle, PA-C 03/28/17 [place], April, MD 03/28/17 [phone]

## 2017-03-28 NOTE — ED Notes (Signed)
ED Provider at bedside. 

## 2017-04-08 DIAGNOSIS — M545 Low back pain: Secondary | ICD-10-CM | POA: Diagnosis not present

## 2017-04-08 DIAGNOSIS — B372 Candidiasis of skin and nail: Secondary | ICD-10-CM | POA: Diagnosis not present

## 2017-04-08 DIAGNOSIS — R0982 Postnasal drip: Secondary | ICD-10-CM | POA: Diagnosis not present

## 2017-04-08 MED FILL — NYSTATIN/TRIAMCINOLONE CRM: 100000-0.1 | 30 days supply | Qty: 60 | Fill #0

## 2017-04-08 MED FILL — FLUTICASONE PROP 50 MCG SPR: 50 | 60 days supply | Qty: 16 | Fill #0

## 2017-04-13 DIAGNOSIS — M5489 Other dorsalgia: Secondary | ICD-10-CM | POA: Diagnosis not present

## 2017-04-13 DIAGNOSIS — G588 Other specified mononeuropathies: Secondary | ICD-10-CM | POA: Diagnosis not present

## 2017-04-13 DIAGNOSIS — M9918 Subluxation complex (vertebral) of rib cage: Secondary | ICD-10-CM | POA: Diagnosis not present

## 2017-05-07 ENCOUNTER — Other Ambulatory Visit: Payer: Self-pay | Admitting: Neurological Surgery

## 2017-05-07 DIAGNOSIS — M545 Low back pain, unspecified: Secondary | ICD-10-CM

## 2017-05-07 DIAGNOSIS — M5414 Radiculopathy, thoracic region: Secondary | ICD-10-CM

## 2017-05-07 DIAGNOSIS — M5136 Other intervertebral disc degeneration, lumbar region: Secondary | ICD-10-CM | POA: Diagnosis not present

## 2017-05-19 ENCOUNTER — Ambulatory Visit
Admission: RE | Admit: 2017-05-19 | Discharge: 2017-05-19 | Disposition: A | Discharge status: Home / Self Care | Payer: 59 | Source: Ambulatory Visit | Attending: Neurological Surgery | Admitting: Neurological Surgery

## 2017-05-19 DIAGNOSIS — M545 Low back pain, unspecified: Secondary | ICD-10-CM

## 2017-05-19 DIAGNOSIS — M5124 Other intervertebral disc displacement, thoracic region: Secondary | ICD-10-CM | POA: Diagnosis not present

## 2017-05-19 DIAGNOSIS — M5414 Radiculopathy, thoracic region: Secondary | ICD-10-CM

## 2017-05-19 DIAGNOSIS — M5126 Other intervertebral disc displacement, lumbar region: Secondary | ICD-10-CM | POA: Diagnosis not present

## 2017-05-28 DIAGNOSIS — M545 Low back pain: Secondary | ICD-10-CM | POA: Diagnosis not present

## 2017-05-28 DIAGNOSIS — Z6841 Body Mass Index (BMI) 40.0 and over, adult: Secondary | ICD-10-CM | POA: Diagnosis not present

## 2017-05-28 DIAGNOSIS — M5136 Other intervertebral disc degeneration, lumbar region: Secondary | ICD-10-CM | POA: Diagnosis not present

## 2017-05-28 DIAGNOSIS — R030 Elevated blood-pressure reading, without diagnosis of hypertension: Secondary | ICD-10-CM | POA: Diagnosis not present

## 2018-01-27 ENCOUNTER — Emergency Department (HOSPITAL_COMMUNITY)
Admission: EM | Admit: 2018-01-27 | Discharge: 2018-01-27 | Disposition: A | Discharge status: Home / Self Care | Payer: 59 | Attending: Emergency Medicine | Admitting: Emergency Medicine

## 2018-01-27 ENCOUNTER — Encounter (HOSPITAL_COMMUNITY): Payer: Self-pay

## 2018-01-27 ENCOUNTER — Emergency Department (HOSPITAL_COMMUNITY): Payer: 59

## 2018-01-27 ENCOUNTER — Other Ambulatory Visit: Payer: Self-pay

## 2018-01-27 DIAGNOSIS — I10 Essential (primary) hypertension: Secondary | ICD-10-CM | POA: Diagnosis not present

## 2018-01-27 DIAGNOSIS — R202 Paresthesia of skin: Secondary | ICD-10-CM | POA: Diagnosis not present

## 2018-01-27 DIAGNOSIS — Z79899 Other long term (current) drug therapy: Secondary | ICD-10-CM | POA: Diagnosis not present

## 2018-01-27 DIAGNOSIS — R200 Anesthesia of skin: Secondary | ICD-10-CM | POA: Diagnosis not present

## 2018-01-27 LAB — CBC WITH DIFFERENTIAL/PLATELET
BASOS ABS: 0 10*3/uL (ref 0.0–0.1)
BASOS PCT: 0 %
Eosinophils Absolute: 0.1 10*3/uL (ref 0.0–0.7)
Eosinophils Relative: 1 %
HEMATOCRIT: 42 % (ref 36.0–46.0)
Hemoglobin: 13.4 g/dL (ref 12.0–15.0)
LYMPHS PCT: 31 %
Lymphs Abs: 2.6 10*3/uL (ref 0.7–4.0)
MCH: 29.5 pg (ref 26.0–34.0)
MCHC: 31.9 g/dL (ref 30.0–36.0)
MCV: 92.3 fL (ref 78.0–100.0)
Monocytes Absolute: 0.3 10*3/uL (ref 0.1–1.0)
Monocytes Relative: 3 %
NEUTROS ABS: 5.3 10*3/uL (ref 1.7–7.7)
NEUTROS PCT: 65 %
Platelets: 280 10*3/uL (ref 150–400)
RBC: 4.55 MIL/uL (ref 3.87–5.11)
RDW: 13.2 % (ref 11.5–15.5)
WBC: 8.3 10*3/uL (ref 4.0–10.5)

## 2018-01-27 LAB — BASIC METABOLIC PANEL
ANION GAP: 7 (ref 5–15)
BUN: 10 mg/dL (ref 6–20)
CALCIUM: 8.9 mg/dL (ref 8.9–10.3)
CO2: 25 mmol/L (ref 22–32)
Chloride: 107 mmol/L (ref 101–111)
Creatinine, Ser: 0.84 mg/dL (ref 0.44–1.00)
Glucose, Bld: 113 mg/dL — ABNORMAL HIGH (ref 65–99)
POTASSIUM: 4 mmol/L (ref 3.5–5.1)
SODIUM: 139 mmol/L (ref 135–145)

## 2018-01-27 LAB — I-STAT TROPONIN, ED: Troponin i, poc: 0 ng/mL (ref 0.00–0.08)

## 2018-01-27 MED ORDER — HYDROCHLOROTHIAZIDE 25 MG PO TABS: 25.0000 mg | ORAL_TABLET | Freq: Every day | ORAL | 0 refills | Status: AC

## 2018-01-27 MED ORDER — ATENOLOL 25 MG PO TABS: 25.0000 mg | ORAL_TABLET | Freq: Every day | ORAL | 0 refills | Status: AC

## 2018-01-27 MED FILL — HYDROCHLOROTHIAZIDE 25 MG T: 25 | 30 days supply | Qty: 30 | Fill #0

## 2018-01-27 MED FILL — ATENOLOL 25 MG TABLET: 25 | 30 days supply | Qty: 30 | Fill #0

## 2018-01-27 NOTE — Discharge Instructions (Addendum)
Your evaluated in the emergency department for elevated blood pressure and some tingling in left arm and leg.  Your blood work and CAT scan did not show an obvious cause of your symptoms.  Your blood pressure was improved here but we are going to restart you on your blood pressure medicine that you are on in the past.  You should keep a record of your blood pressure and please follow-up with your primary care doctor soon.  Return to the emergency department if any worsening symptoms.

## 2018-01-27 NOTE — ED Notes (Signed)
Bed: WA06 Expected date:  Expected time:  Means of arrival:  Comments: 

## 2018-01-27 NOTE — ED Triage Notes (Signed)
Patient is an employee of [employer], and was brought in by [facility] for Hypertension. Patient reports today she was working and noted feeling "numbness" in her left arm and left leg. Patient denies chest pain/SOB. Patient reports "Im under a lot of stress and that could be the problem." Patient reports having previously been on medication for hypertension, but was weaned off by her PCP. Patient reports "this is the highest its been in I dont know how long. Its usually 120/90"

## 2018-01-27 NOTE — ED Provider Notes (Signed)
[facility] DEPT Provider Note   CSN: [id number] Arrival date & time: 01/27/18  1011     History   Chief Complaint Chief Complaint  Patient presents with  . Hypertension    HPI TRAM WRENN is a 52 y.o. female.  She has a history of hypertension and has been on medicines in the past but was weaned off from when her blood pressure was improved.  Last time she had her blood pressure checked was about a year ago.  She has been troubled with some chronic sciatic pain down her back and right hip.  She also states she is increased stress.  She presents here today after being evaluated at work for high blood pressure and some tingling in her left arm and left leg.  She states she is woke up with symptoms like they fell asleep and were slowly waking up with some pins-and-needles.  She has had this before but not to this extent.  When they checked her blood pressure in the office she had a diastolic of 517.  They referred her here for further evaluation.  She denies any blurry vision  double vision chest pain shortness of breath weakness unsteadiness.    The history is provided by the patient.  Hypertension  This is a recurrent problem. The problem has been gradually improving. Pertinent negatives include no chest pain, no abdominal pain, no headaches and no shortness of breath. Nothing aggravates the symptoms. Nothing relieves the symptoms. She has tried nothing for the symptoms. The treatment provided no relief.    Past Medical History:  Diagnosis Date  . Bulging lumbar disc   . Complication of anesthesia    slow to awaken firsy surgery, none since  . GERD (gastroesophageal reflux disease)   . Headache(784.0)    otc med prn, hx migraines, none recent  . Hypertension   . Sciatic nerve pain   . Seasonal allergies     There are no active problems to display for this patient.   Past Surgical History:  Procedure Laterality Date  . ABDOMINAL HYSTERECTOMY   2007  . BILATERAL SALPINGECTOMY N/A 02/10/2013   Procedure: BILATERAL SALPINGECTOMY with peritoneal washings;  Surgeon: Allena Katz, MD;  Location: [facility];  Service: Gynecology;  Laterality: N/A;  . CESAREAN SECTION     x 1  . COLONOSCOPY N/A 03/15/2016   Procedure: COLONOSCOPY;  Surgeon: Clarene Essex, MD;  Location: [facility];  Service: Endoscopy;  Laterality: N/A;  . DIAGNOSTIC LAPAROSCOPY     endometriosis and pelvic pain  . ESOPHAGOGASTRODUODENOSCOPY N/A 03/15/2016   Procedure: ESOPHAGOGASTRODUODENOSCOPY (EGD);  Surgeon: Clarene Essex, MD;  Location: [facility];  Service: Endoscopy;  Laterality: N/A;  . LAPAROSCOPY N/A 02/10/2013   Procedure: LAPAROSCOPY OPERATIVE;  Surgeon: Allena Katz, MD;  Location: [facility];  Service: Gynecology;  Laterality: N/A;     OB History   None      Home Medications    Prior to Admission medications   Medication Sig Start Date End Date Taking? Authorizing Provider  omeprazole (PRILOSEC) 20 MG capsule Take 20 mg by mouth daily as needed (acid reflu).    [provider]  ondansetron (ZOFRAN-ODT) 4 MG disintegrating tablet Take 4 mg by mouth every 8 (eight) hours as needed for nausea or vomiting.    [provider]  pantoprazole (PROTONIX) 40 MG tablet Take 40 mg by mouth daily as needed (acid reflux).  02/23/16   [provider]  predniSONE (  DELTASONE) 20 MG tablet Take 2 tablets (40 mg total) by mouth daily. 03/28/17   Montine Circle, PA-C    Family History History reviewed. No pertinent family history.  Social History Social History   Tobacco Use  . Smoking status: Never Smoker  . Smokeless tobacco: Never Used  Substance Use Topics  . Alcohol use: No  . Drug use: No     Allergies   Ibuprofen; Raspberry; and Strawberry (diagnostic)   Review of Systems Review of Systems  Constitutional: Negative for chills and fever.  HENT: Negative for ear pain and sore throat.   Eyes: Negative for pain and visual  disturbance.  Respiratory: Negative for cough and shortness of breath.   Cardiovascular: Negative for chest pain and palpitations.  Gastrointestinal: Negative for abdominal pain and vomiting.  Genitourinary: Negative for dysuria and hematuria.  Musculoskeletal: Negative for arthralgias and back pain.  Skin: Negative for color change and rash.  Neurological: Positive for numbness. Negative for seizures, syncope and headaches.  Psychiatric/Behavioral: Positive for dysphoric mood.  All other systems reviewed and are negative.    Physical Exam Updated Vital Signs BP (!) 156/120 (BP Location: Right Arm)   Pulse 89   Temp 98.7 F (37.1 C) (Oral)   Resp 18   SpO2 100%   Physical Exam  Constitutional: She appears well-developed and well-nourished. No distress.  HENT:  Head: Normocephalic and atraumatic.  Eyes: Conjunctivae are normal.  Neck: Neck supple.  Cardiovascular: Normal rate and regular rhythm.  No murmur heard. Pulmonary/Chest: Effort normal and breath sounds normal. No respiratory distress.  Abdominal: Soft. There is no tenderness.  Musculoskeletal: She exhibits no edema.  Neurological: She is alert. She has normal strength. A sensory deficit (subjective decreased left arm and leg) is present. No cranial nerve deficit. She displays a negative Romberg sign. GCS eye subscore is 4. GCS verbal subscore is 5. GCS motor subscore is 6.  Skin: Skin is warm and dry.  Psychiatric: She has a normal mood and affect.  Nursing note and vitals reviewed.    ED Treatments / Results  Labs (all labs ordered are listed, but only abnormal results are displayed) Labs Reviewed  BASIC METABOLIC PANEL - Abnormal; Notable for the following components:      Result Value   Glucose, Bld 113 (*)    All other components within normal limits  CBC WITH DIFFERENTIAL/PLATELET  I-STAT TROPONIN, ED    EKG EKG Interpretation  Date/Time:  Monday January 27 2018 10:59:07 EDT Ventricular Rate:  79 PR  Interval:    QRS Duration: 92 QT Interval:  424 QTC Calculation: 487 R Axis:   48 Text Interpretation:  Sinus rhythm Low voltage, precordial leads Borderline T abnormalities, anterior leads Borderline prolonged QT interval similar to prior except slower rate now c/w 8/18 Confirmed by Aletta Edouard [phone]) on 01/27/2018 11:09:27 AM Also confirmed by Aletta Edouard [phone]), editor [place], Jeannetta Nap [phone])  on 01/27/2018 11:14:37 AM   Radiology Ct Head Wo Contrast  Result Date: 01/27/2018 CLINICAL DATA:  Left-sided numbness with hypertension EXAM: CT HEAD WITHOUT CONTRAST TECHNIQUE: Contiguous axial images were obtained from the base of the skull through the vertex without intravenous contrast. COMPARISON:  None. FINDINGS: Brain: The ventricles are normal in size and configuration. There is no intracranial mass, hemorrhage, extra-axial fluid collection, or midline shift. Gray-white compartments appear normal. No acute infarct evident. Vascular: No evident hyperdense vessels. No appreciable vascular calcification. Skull: Bony calvarium appears intact. Sinuses/Orbits: There is mild mucosal thickening in several  ethmoid air cells. Other visualized paranasal sinuses are clear. Orbits appear symmetric bilaterally. Other: Mastoid air cells are clear. IMPRESSION: Mild mucosal thickening in several ethmoid air cells. Study otherwise unremarkable. Electronically Signed   By: Lowella Grip III M.D.   On: 01/27/2018 11:23    Procedures Procedures (including critical care time)  Medications Ordered in ED Medications - No data to display   Initial Impression / Assessment and Plan / ED Course  I have reviewed the triage vital signs and the nursing notes.  Pertinent labs & imaging results that were available during my care of the patient were reviewed by me and considered in my medical decision making (see chart for details).  Clinical Course as of Jan 29 947  Mon Jan 28, 5935  8958 52 year old female  here with elevated blood pressure and some tingling in her left arm and leg.  Her neurologic exam is normal other than subjective decreased sensation.  She had normal coordination and strength.  The rest of her exam is benign.  We are checking some screening labs EKG and a head CT.  Was is possibly some sort of hypertensive urgency and less likely stroke.  We will continue to follow her exam..    [MB]  1208 Reviewed patient's labs and imaging with her.  She is comfortable going home and following up with her PCP for further evaluation.  She states her tingling in her arm and leg is resolved.  She is asking and I think is reasonable that we restart her on her blood pressure medicine and I told her to keep a log of her BPs to bring to her doctor.   [MB]    Clinical Course User Index [MB] Hayden Rasmussen, MD    Final Clinical Impressions(s) / ED Diagnoses   Final diagnoses:  Hypertension, unspecified type  Paresthesia    ED Discharge Orders        Ordered    atenolol (TENORMIN) 25 MG tablet  Daily     01/27/18 1209    hydrochlorothiazide (HYDRODIURIL) 25 MG tablet  Daily     01/27/18 1209       Hayden Rasmussen, MD 01/28/18 [phone]

## 2018-01-27 NOTE — ED Notes (Signed)
Bed: LW85 Expected date:  Expected time:  Means of arrival:  Comments: Pharmacy tech-HTN-left sided numbness

## 2019-04-06 DIAGNOSIS — M549 Dorsalgia, unspecified: Secondary | ICD-10-CM | POA: Diagnosis not present

## 2019-04-06 MED FILL — CYCLOBENZAPRINE 5 MG TABLET: 5 | 5 days supply | Qty: 5 | Fill #0

## 2019-04-13 DIAGNOSIS — M546 Pain in thoracic spine: Secondary | ICD-10-CM | POA: Diagnosis not present

## 2019-04-13 DIAGNOSIS — M5116 Intervertebral disc disorders with radiculopathy, lumbar region: Secondary | ICD-10-CM | POA: Diagnosis not present

## 2019-04-13 DIAGNOSIS — M9903 Segmental and somatic dysfunction of lumbar region: Secondary | ICD-10-CM | POA: Diagnosis not present

## 2019-04-13 DIAGNOSIS — M5136 Other intervertebral disc degeneration, lumbar region: Secondary | ICD-10-CM | POA: Diagnosis not present

## 2019-04-21 DIAGNOSIS — R110 Nausea: Secondary | ICD-10-CM | POA: Diagnosis not present

## 2019-04-21 DIAGNOSIS — E1165 Type 2 diabetes mellitus with hyperglycemia: Secondary | ICD-10-CM | POA: Diagnosis not present

## 2019-04-21 DIAGNOSIS — R109 Unspecified abdominal pain: Secondary | ICD-10-CM | POA: Diagnosis not present

## 2019-04-21 DIAGNOSIS — Z0000 Encounter for general adult medical examination without abnormal findings: Secondary | ICD-10-CM | POA: Diagnosis not present

## 2019-04-21 DIAGNOSIS — I10 Essential (primary) hypertension: Secondary | ICD-10-CM | POA: Diagnosis not present

## 2019-04-21 DIAGNOSIS — Z6841 Body Mass Index (BMI) 40.0 and over, adult: Secondary | ICD-10-CM | POA: Diagnosis not present

## 2019-04-21 DIAGNOSIS — K219 Gastro-esophageal reflux disease without esophagitis: Secondary | ICD-10-CM | POA: Diagnosis not present

## 2019-04-21 DIAGNOSIS — E559 Vitamin D deficiency, unspecified: Secondary | ICD-10-CM | POA: Diagnosis not present

## 2019-04-21 DIAGNOSIS — E1169 Type 2 diabetes mellitus with other specified complication: Secondary | ICD-10-CM | POA: Diagnosis not present

## 2019-04-21 MED FILL — PANTOPRAZOLE SOD DR 20 MG T: 20 | 30 days supply | Qty: 60 | Fill #0

## 2019-04-21 MED FILL — AMLODIPINE 2.5 MG TABLET: 2.5 | 30 days supply | Qty: 30 | Fill #0

## 2019-04-22 MED FILL — metFORMIN HCL 500 MG TABS: 500 | 90 days supply | Qty: 180 | Fill #0

## 2019-04-22 MED FILL — VIT D3-50 50,000 UNITS CAPS: 1.25 MG | 56 days supply | Qty: 8 | Fill #0

## 2019-04-22 MED FILL — ATORVASTATIN 80 MG TABLET: 80 | 90 days supply | Qty: 90 | Fill #0

## 2019-05-19 DIAGNOSIS — E1169 Type 2 diabetes mellitus with other specified complication: Secondary | ICD-10-CM | POA: Diagnosis not present

## 2019-05-19 DIAGNOSIS — E1165 Type 2 diabetes mellitus with hyperglycemia: Secondary | ICD-10-CM | POA: Diagnosis not present

## 2019-05-19 DIAGNOSIS — R110 Nausea: Secondary | ICD-10-CM | POA: Diagnosis not present

## 2019-05-19 DIAGNOSIS — Z713 Dietary counseling and surveillance: Secondary | ICD-10-CM | POA: Diagnosis not present

## 2019-05-19 DIAGNOSIS — I10 Essential (primary) hypertension: Secondary | ICD-10-CM | POA: Diagnosis not present

## 2019-05-19 MED FILL — AMLODIPINE BESYLATE 5 MG TA: 5 | 90 days supply | Qty: 90 | Fill #0

## 2019-06-27 DIAGNOSIS — N390 Urinary tract infection, site not specified: Secondary | ICD-10-CM | POA: Diagnosis not present

## 2019-07-27 ENCOUNTER — Other Ambulatory Visit (HOSPITAL_BASED_OUTPATIENT_CLINIC_OR_DEPARTMENT_OTHER): Payer: Self-pay | Admitting: Family Medicine

## 2019-07-27 DIAGNOSIS — R140 Abdominal distension (gaseous): Secondary | ICD-10-CM

## 2019-07-27 DIAGNOSIS — E1169 Type 2 diabetes mellitus with other specified complication: Secondary | ICD-10-CM | POA: Diagnosis not present

## 2019-07-27 DIAGNOSIS — R110 Nausea: Secondary | ICD-10-CM

## 2019-07-27 DIAGNOSIS — E559 Vitamin D deficiency, unspecified: Secondary | ICD-10-CM | POA: Diagnosis not present

## 2019-07-27 DIAGNOSIS — E1165 Type 2 diabetes mellitus with hyperglycemia: Secondary | ICD-10-CM | POA: Diagnosis not present

## 2019-07-27 DIAGNOSIS — K219 Gastro-esophageal reflux disease without esophagitis: Secondary | ICD-10-CM | POA: Diagnosis not present

## 2019-07-27 DIAGNOSIS — R1011 Right upper quadrant pain: Secondary | ICD-10-CM | POA: Diagnosis not present

## 2019-07-27 DIAGNOSIS — I10 Essential (primary) hypertension: Secondary | ICD-10-CM | POA: Diagnosis not present

## 2019-07-27 MED FILL — ATORVASTATIN 80 MG TABLET: 80 | 90 days supply | Qty: 90 | Fill #0

## 2019-07-27 MED FILL — ONDANSETRON ODT 4 MG TABLET: 4 | 10 days supply | Qty: 30 | Fill #0

## 2019-07-27 MED FILL — metFORMIN HCL 500 MG TABS: 500 | 90 days supply | Qty: 180 | Fill #0

## 2019-07-27 MED FILL — PANTOPRAZOLE SOD DR 20 MG T: 20 | 90 days supply | Qty: 180 | Fill #0

## 2019-07-28 MED FILL — AMLODIPINE BESYLATE 10 MG T: 10 | 90 days supply | Qty: 90 | Fill #0

## 2019-07-29 ENCOUNTER — Other Ambulatory Visit: Payer: Self-pay

## 2019-07-29 ENCOUNTER — Ambulatory Visit (HOSPITAL_BASED_OUTPATIENT_CLINIC_OR_DEPARTMENT_OTHER)
Admission: RE | Admit: 2019-07-29 | Discharge: 2019-07-29 | Disposition: A | Discharge status: Home / Self Care | Payer: 59 | Source: Ambulatory Visit | Attending: Family Medicine | Admitting: Family Medicine

## 2019-07-29 ENCOUNTER — Encounter (HOSPITAL_BASED_OUTPATIENT_CLINIC_OR_DEPARTMENT_OTHER): Payer: Self-pay

## 2019-07-29 DIAGNOSIS — R140 Abdominal distension (gaseous): Secondary | ICD-10-CM | POA: Insufficient documentation

## 2019-07-29 DIAGNOSIS — R1011 Right upper quadrant pain: Secondary | ICD-10-CM | POA: Diagnosis not present

## 2019-07-29 DIAGNOSIS — R110 Nausea: Secondary | ICD-10-CM | POA: Insufficient documentation

## 2019-07-29 DIAGNOSIS — D3002 Benign neoplasm of left kidney: Secondary | ICD-10-CM | POA: Diagnosis not present

## 2019-07-29 HISTORY — DX: Type 2 diabetes mellitus without complications: E11.9

## 2019-07-29 MED ORDER — IOHEXOL 300 MG/ML  SOLN
100.0000 mL | Freq: Once | INTRAMUSCULAR | Status: AC | PRN
  Administered 2019-07-29: 100 mL via INTRAVENOUS

## 2019-08-07 ENCOUNTER — Other Ambulatory Visit: Payer: Self-pay

## 2019-08-07 ENCOUNTER — Ambulatory Visit
Admission: RE | Admit: 2019-08-07 | Discharge: 2019-08-07 | Disposition: A | Discharge status: Home / Self Care | Payer: 59 | Source: Ambulatory Visit | Attending: Family Medicine | Admitting: Family Medicine

## 2019-08-07 ENCOUNTER — Other Ambulatory Visit: Payer: Self-pay | Admitting: Family Medicine

## 2019-08-07 DIAGNOSIS — M25551 Pain in right hip: Secondary | ICD-10-CM

## 2019-08-26 DIAGNOSIS — I10 Essential (primary) hypertension: Secondary | ICD-10-CM | POA: Diagnosis not present

## 2019-08-26 DIAGNOSIS — E1165 Type 2 diabetes mellitus with hyperglycemia: Secondary | ICD-10-CM | POA: Diagnosis not present

## 2019-08-26 MED FILL — CHLORTHALIDONE 25 MG TABS: 25 | 30 days supply | Qty: 15 | Fill #0

## 2019-09-23 DIAGNOSIS — U071 COVID-19: Secondary | ICD-10-CM | POA: Diagnosis not present

## 2019-09-28 DIAGNOSIS — B342 Coronavirus infection, unspecified: Secondary | ICD-10-CM | POA: Diagnosis not present

## 2019-09-28 DIAGNOSIS — R430 Anosmia: Secondary | ICD-10-CM | POA: Diagnosis not present

## 2019-09-28 DIAGNOSIS — R05 Cough: Secondary | ICD-10-CM | POA: Diagnosis not present

## 2019-09-28 DIAGNOSIS — R5383 Other fatigue: Secondary | ICD-10-CM | POA: Diagnosis not present

## 2019-09-28 DIAGNOSIS — R432 Parageusia: Secondary | ICD-10-CM | POA: Diagnosis not present

## 2019-10-13 DIAGNOSIS — Z09 Encounter for follow-up examination after completed treatment for conditions other than malignant neoplasm: Secondary | ICD-10-CM | POA: Diagnosis not present

## 2019-10-13 DIAGNOSIS — I10 Essential (primary) hypertension: Secondary | ICD-10-CM | POA: Diagnosis not present

## 2019-10-13 MED FILL — CHLORTHALIDONE 25 MG TABS: 25 | 90 days supply | Qty: 45 | Fill #0

## 2019-11-04 MED FILL — AMLODIPINE BESYLATE 10 MG T: 10 | 90 days supply | Qty: 90 | Fill #1

## 2019-11-04 MED FILL — PANTOPRAZOLE SOD DR 20 MG T: 20 | 90 days supply | Qty: 180 | Fill #1

## 2019-11-04 MED FILL — ATORVASTATIN 80 MG TABLET: 80 | 90 days supply | Qty: 90 | Fill #1

## 2019-11-04 MED FILL — metFORMIN HCL 500 MG TABS: 500 | 90 days supply | Qty: 180 | Fill #1

## 2020-01-12 MED FILL — CHLORTHALIDONE 25 MG TABS: 25 | 90 days supply | Qty: 45 | Fill #1

## 2020-02-16 ENCOUNTER — Other Ambulatory Visit (HOSPITAL_COMMUNITY): Payer: Self-pay | Admitting: Family Medicine

## 2020-02-16 MED FILL — AMLODIPINE BESYLATE 10 MG T: 10 | 90 days supply | Qty: 90 | Fill #0

## 2020-02-16 MED FILL — ATORVASTATIN 80 MG TABLET: 80 | 90 days supply | Qty: 90 | Fill #2

## 2020-02-16 MED FILL — PANTOPRAZOLE SOD DR 20 MG T: 20 | 90 days supply | Qty: 180 | Fill #2

## 2020-02-16 MED FILL — METFORMIN HCL 500 MG TABS: 500 | 90 days supply | Qty: 180 | Fill #2

## 2020-04-14 MED FILL — CHLORTHALIDONE 25 MG TABS: 25 | 90 days supply | Qty: 45 | Fill #2

## 2020-04-21 ENCOUNTER — Other Ambulatory Visit: Payer: Self-pay | Admitting: Family Medicine

## 2020-04-21 DIAGNOSIS — Z1239 Encounter for other screening for malignant neoplasm of breast: Secondary | ICD-10-CM | POA: Diagnosis not present

## 2020-04-21 DIAGNOSIS — E1165 Type 2 diabetes mellitus with hyperglycemia: Secondary | ICD-10-CM | POA: Diagnosis not present

## 2020-04-21 DIAGNOSIS — Z1231 Encounter for screening mammogram for malignant neoplasm of breast: Secondary | ICD-10-CM

## 2020-04-21 DIAGNOSIS — E559 Vitamin D deficiency, unspecified: Secondary | ICD-10-CM | POA: Diagnosis not present

## 2020-04-21 DIAGNOSIS — Z0000 Encounter for general adult medical examination without abnormal findings: Secondary | ICD-10-CM | POA: Diagnosis not present

## 2020-04-21 DIAGNOSIS — I10 Essential (primary) hypertension: Secondary | ICD-10-CM | POA: Diagnosis not present

## 2020-04-21 DIAGNOSIS — E1169 Type 2 diabetes mellitus with other specified complication: Secondary | ICD-10-CM | POA: Diagnosis not present

## 2020-07-13 MED FILL — METFORMIN HCL 500 MG TABS: 500 | 90 days supply | Qty: 180 | Fill #3

## 2020-07-13 MED FILL — ATORVASTATIN 80 MG TABLET: 80 | 90 days supply | Qty: 90 | Fill #3

## 2020-07-13 MED FILL — ONDANSETRON ODT 4 MG TABLET: 4 | 10 days supply | Qty: 30 | Fill #1

## 2020-07-13 MED FILL — AMLODIPINE BESYLATE 10 MG T: 10 | 90 days supply | Qty: 90 | Fill #1

## 2020-07-13 MED FILL — PANTOPRAZOLE SOD DR 20 MG T: 20 | 90 days supply | Qty: 180 | Fill #3

## 2020-08-02 ENCOUNTER — Other Ambulatory Visit (HOSPITAL_COMMUNITY): Payer: Self-pay | Admitting: Family Medicine

## 2020-08-02 DIAGNOSIS — N390 Urinary tract infection, site not specified: Secondary | ICD-10-CM | POA: Diagnosis not present

## 2020-08-02 DIAGNOSIS — R319 Hematuria, unspecified: Secondary | ICD-10-CM | POA: Diagnosis not present

## 2020-08-02 DIAGNOSIS — R300 Dysuria: Secondary | ICD-10-CM | POA: Diagnosis not present

## 2020-08-02 MED FILL — CHLORTHALIDONE 25 MG TABS: 25 | 90 days supply | Qty: 45 | Fill #3

## 2020-08-02 MED FILL — NITROFURANTOIN MONO-MCR 100: 100 | 5 days supply | Qty: 10 | Fill #0

## 2020-08-19 DIAGNOSIS — N399 Disorder of urinary system, unspecified: Secondary | ICD-10-CM | POA: Diagnosis not present

## 2020-08-19 DIAGNOSIS — N390 Urinary tract infection, site not specified: Secondary | ICD-10-CM | POA: Diagnosis not present

## 2020-12-01 ENCOUNTER — Other Ambulatory Visit (HOSPITAL_COMMUNITY): Payer: Self-pay

## 2020-12-01 DIAGNOSIS — K5900 Constipation, unspecified: Secondary | ICD-10-CM | POA: Diagnosis not present

## 2020-12-01 DIAGNOSIS — E1169 Type 2 diabetes mellitus with other specified complication: Secondary | ICD-10-CM | POA: Diagnosis not present

## 2020-12-01 DIAGNOSIS — E559 Vitamin D deficiency, unspecified: Secondary | ICD-10-CM | POA: Diagnosis not present

## 2020-12-01 DIAGNOSIS — K219 Gastro-esophageal reflux disease without esophagitis: Secondary | ICD-10-CM | POA: Diagnosis not present

## 2020-12-01 DIAGNOSIS — M48061 Spinal stenosis, lumbar region without neurogenic claudication: Secondary | ICD-10-CM | POA: Diagnosis not present

## 2020-12-01 DIAGNOSIS — I10 Essential (primary) hypertension: Secondary | ICD-10-CM | POA: Diagnosis not present

## 2020-12-01 MED ORDER — PANTOPRAZOLE SODIUM 20 MG PO TBEC
20.0000 mg | DELAYED_RELEASE_TABLET | Freq: Two times a day (BID) | ORAL | 3 refills | Status: AC
  Filled 2020-12-01: qty 180, 90d supply, fill #0
  Filled 2021-03-15: qty 180, 90d supply, fill #1
  Filled 2021-07-09: qty 180, 90d supply, fill #2
  Filled 2021-11-29: qty 120, 60d supply, fill #3
  Filled 2021-11-30: qty 60, 30d supply, fill #3

## 2020-12-01 MED ORDER — METFORMIN HCL 500 MG PO TABS
500.0000 mg | ORAL_TABLET | Freq: Two times a day (BID) | ORAL | 3 refills | Status: AC
  Filled 2020-12-01: qty 180, 90d supply, fill #0
  Filled 2021-03-15: qty 180, 90d supply, fill #1
  Filled 2021-07-09: qty 180, 90d supply, fill #2
  Filled 2021-11-29: qty 180, 90d supply, fill #3

## 2020-12-01 MED ORDER — CHLORTHALIDONE 25 MG PO TABS
12.5000 mg | ORAL_TABLET | Freq: Every day | ORAL | 3 refills | Status: AC
  Filled 2020-12-01: qty 90, 90d supply, fill #0
  Filled 2021-03-15: qty 90, 90d supply, fill #1
  Filled 2021-07-09: qty 90, 90d supply, fill #2
  Filled 2021-11-29: qty 90, 90d supply, fill #3

## 2020-12-01 MED ORDER — AMLODIPINE BESYLATE 10 MG PO TABS
10.0000 mg | ORAL_TABLET | Freq: Every day | ORAL | 3 refills | Status: AC
  Filled 2020-12-01: qty 90, 90d supply, fill #0
  Filled 2021-03-15: qty 90, 90d supply, fill #1
  Filled 2021-07-09: qty 90, 90d supply, fill #2
  Filled 2021-11-29: qty 90, 90d supply, fill #3

## 2020-12-01 MED ORDER — ATORVASTATIN CALCIUM 80 MG PO TABS
80.0000 mg | ORAL_TABLET | Freq: Every day | ORAL | 3 refills | Status: AC
  Filled 2020-12-01: qty 90, 90d supply, fill #0
  Filled 2021-03-15: qty 90, 90d supply, fill #1
  Filled 2021-07-09: qty 90, 90d supply, fill #2
  Filled 2021-11-29: qty 90, 90d supply, fill #3

## 2020-12-02 ENCOUNTER — Other Ambulatory Visit (HOSPITAL_COMMUNITY): Payer: Self-pay

## 2021-01-16 DIAGNOSIS — M5416 Radiculopathy, lumbar region: Secondary | ICD-10-CM | POA: Diagnosis not present

## 2021-03-15 ENCOUNTER — Other Ambulatory Visit (HOSPITAL_COMMUNITY): Payer: Self-pay

## 2021-07-10 ENCOUNTER — Other Ambulatory Visit (HOSPITAL_COMMUNITY): Payer: Self-pay

## 2021-08-25 IMAGING — CT CT ABDOMEN W/ CM
2 of 5 series · 15 of 46 positions shown, 17 images · IV contrast (Omnipaque)
Comparison: CT scan 12/08/2012

CLINICAL DATA: Chronic right upper quadrant and back pain and
nausea.

EXAM:
CT ABDOMEN WITH CONTRAST
TECHNIQUE: Multidetector CT imaging of the abdomen was performed using the
standard protocol following bolus administration of intravenous
contrast.
CONTRAST:  100mL OMNIPAQUE IOHEXOL 300 MG/ML  SOLN

[Series 2: axial st · axial · 0.86mm/px · z∈[-310,-86]mm · 12 of 53 slices shown, 14 images]
[im 4/53  soft-tissue]
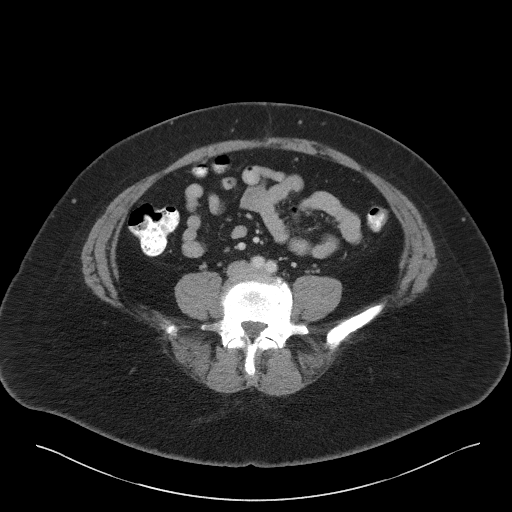
[im 4/53  bone]
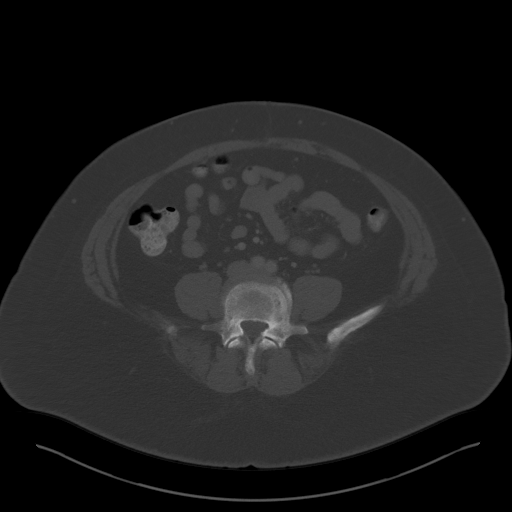
[im 7/53  soft-tissue]
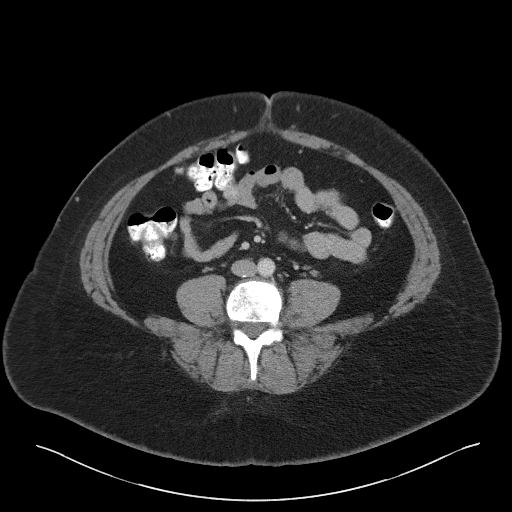
[im 14/53  soft-tissue]
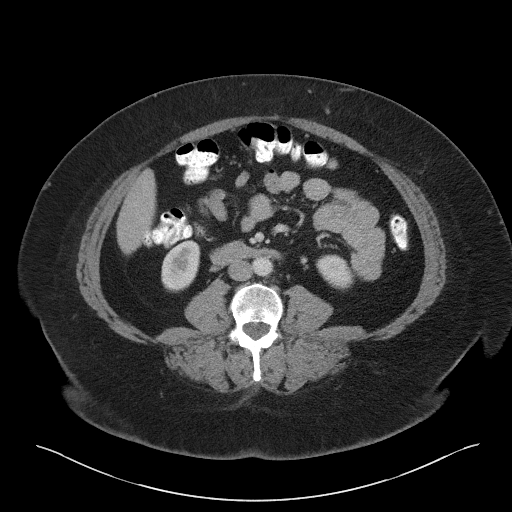
[im 17/53  soft-tissue]
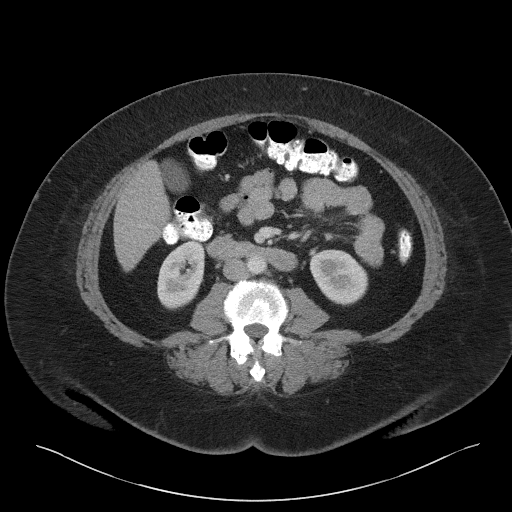
[im 20/53  soft-tissue]
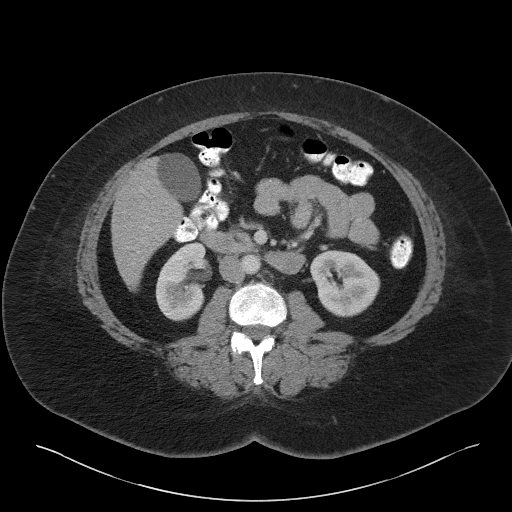
[im 23/53  soft-tissue]
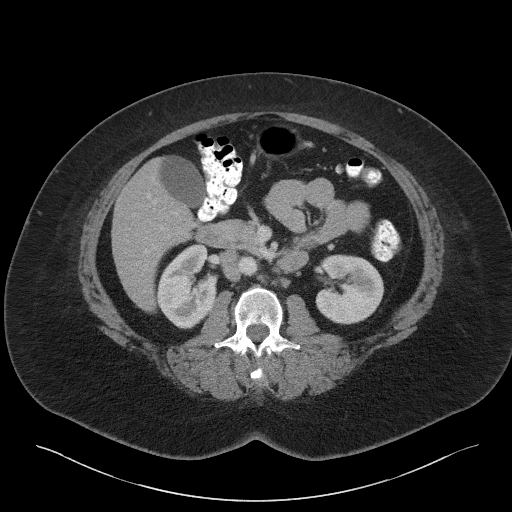
[im 30/53  soft-tissue]
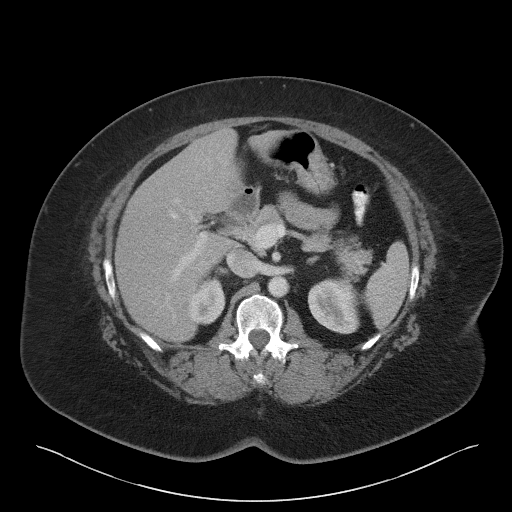
[im 33/53  soft-tissue]
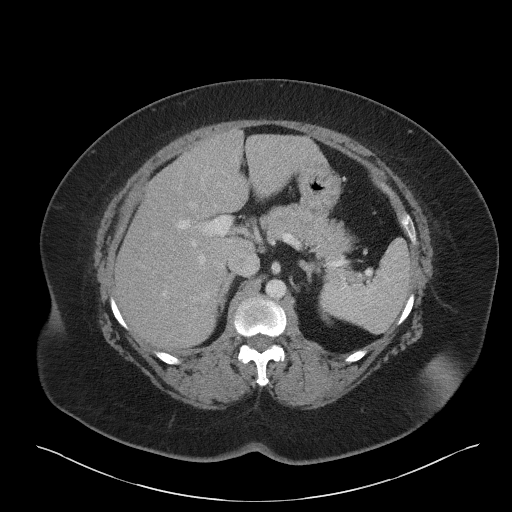
[im 36/53  soft-tissue]
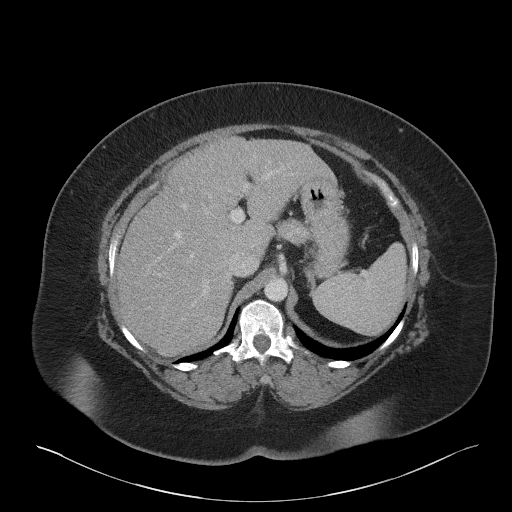
[im 36/53  bone]
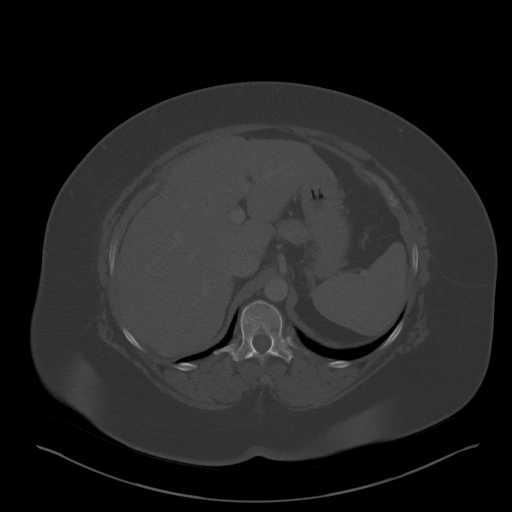
[im 40/53  soft-tissue]
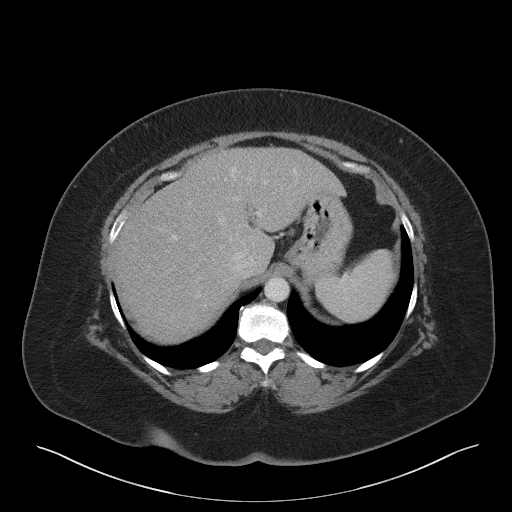
[im 46/53  soft-tissue]
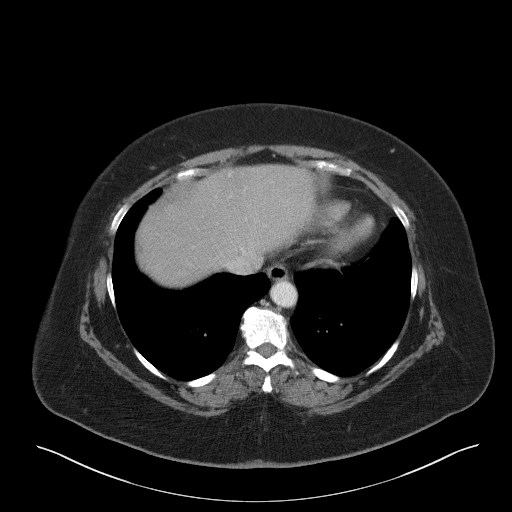
[im 49/53  soft-tissue]
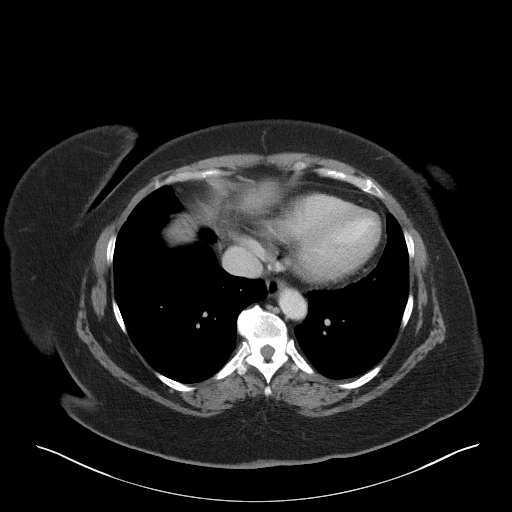

[Series 5: coronal st · coronal · 0.56mm/px · 3 of 113 slices shown]
[im 38/113  soft-tissue]
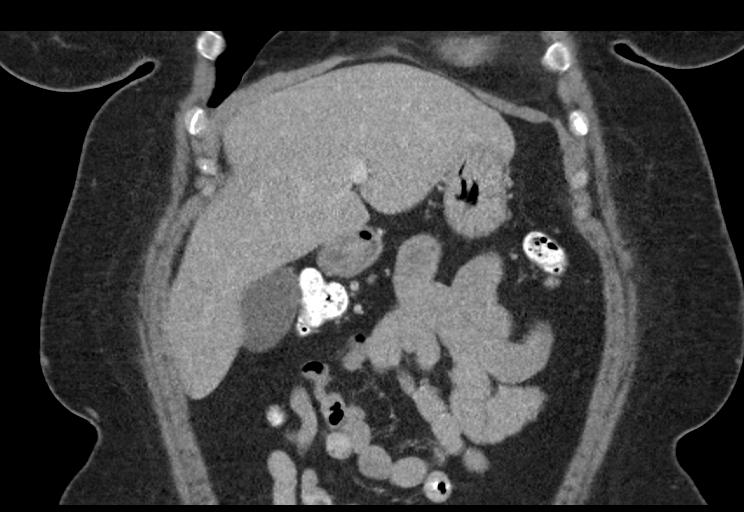
[im 50/113  soft-tissue]
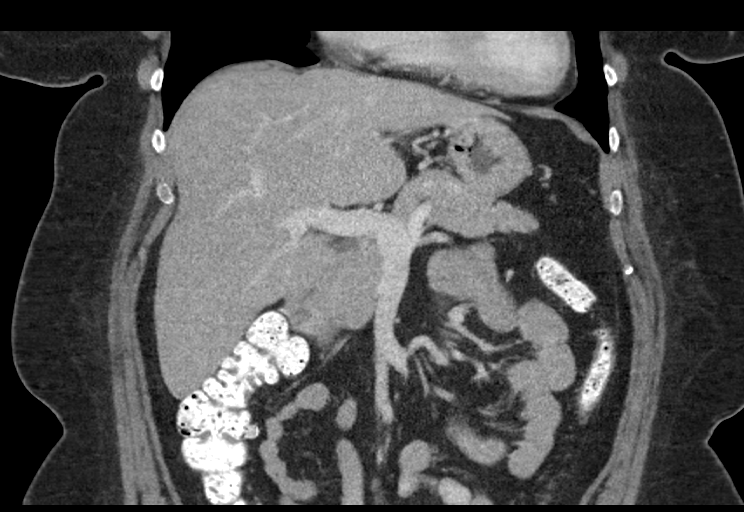
[im 63/113  soft-tissue]
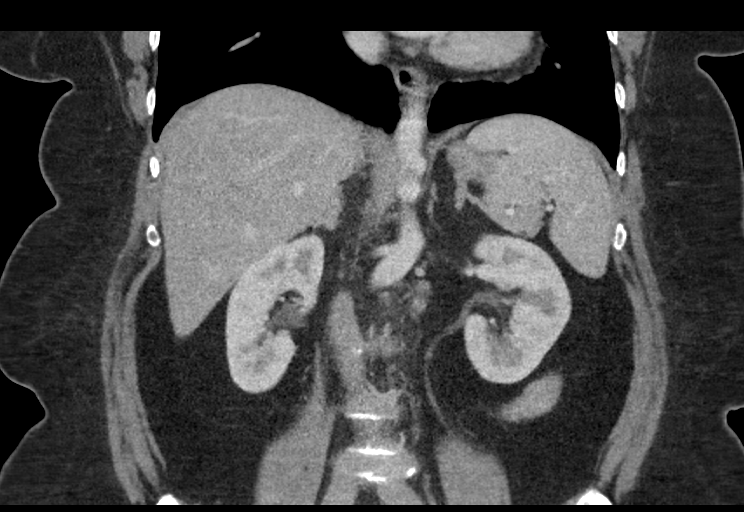

[15 of 46 positions shown; findings below may reference images not displayed]

FINDINGS: Lower chest: 4 mm nodule in the right lower lobe is unchanged.
mm nodule at the left lung base is unchanged. No acute pulmonary
findings. The heart is normal in size. No pericardial effusion. The
distal esophagus is grossly normal.

Hepatobiliary: No focal hepatic lesions or intrahepatic biliary
dilatation. The portal and hepatic veins are patent. The gallbladder
is normal. No common bile duct dilatation.

Pancreas: No mass, inflammation or ductal dilatation.

Spleen: Normal size.  No focal lesions.

Adrenals/Urinary Tract: The adrenal glands and kidneys are
unremarkable. There is a small angiomyolipoma associated with the
posterior cortex of the left kidney. No renal calculi or
hydronephrosis.

Stomach/Bowel: The stomach, duodenum, visualized small bowel loops
and visualized colon are unremarkable. No acute inflammatory changes
or obstructive findings.

Vascular/Lymphatic: Small scattered mesenteric and retroperitoneal
lymph nodes are stable. No mass or adenopathy. The aorta and branch
vessels are patent. The major venous structures are patent.

Other: No ascites or abdominal wall hernia.

Musculoskeletal: No significant bony findings.
IMPRESSION: No acute abdominal findings, mass lesions or adenopathy.

Small basilar pulmonary nodules are unchanged since 1656 and
considered benign.

Small angiomyolipoma associated with the left kidney.

## 2021-08-29 DIAGNOSIS — M25551 Pain in right hip: Secondary | ICD-10-CM | POA: Diagnosis not present

## 2021-08-29 DIAGNOSIS — Z6841 Body Mass Index (BMI) 40.0 and over, adult: Secondary | ICD-10-CM | POA: Diagnosis not present

## 2021-08-31 DIAGNOSIS — Z0000 Encounter for general adult medical examination without abnormal findings: Secondary | ICD-10-CM | POA: Diagnosis not present

## 2021-08-31 DIAGNOSIS — Z6839 Body mass index (BMI) 39.0-39.9, adult: Secondary | ICD-10-CM | POA: Diagnosis not present

## 2021-08-31 DIAGNOSIS — F32A Depression, unspecified: Secondary | ICD-10-CM | POA: Diagnosis not present

## 2021-08-31 DIAGNOSIS — M47816 Spondylosis without myelopathy or radiculopathy, lumbar region: Secondary | ICD-10-CM | POA: Diagnosis not present

## 2021-08-31 DIAGNOSIS — Z23 Encounter for immunization: Secondary | ICD-10-CM | POA: Diagnosis not present

## 2021-08-31 DIAGNOSIS — Z1211 Encounter for screening for malignant neoplasm of colon: Secondary | ICD-10-CM | POA: Diagnosis not present

## 2021-08-31 DIAGNOSIS — E1169 Type 2 diabetes mellitus with other specified complication: Secondary | ICD-10-CM | POA: Diagnosis not present

## 2021-08-31 DIAGNOSIS — I10 Essential (primary) hypertension: Secondary | ICD-10-CM | POA: Diagnosis not present

## 2021-08-31 DIAGNOSIS — E559 Vitamin D deficiency, unspecified: Secondary | ICD-10-CM | POA: Diagnosis not present

## 2021-09-09 DIAGNOSIS — M25551 Pain in right hip: Secondary | ICD-10-CM | POA: Diagnosis not present

## 2021-09-18 DIAGNOSIS — M1611 Unilateral primary osteoarthritis, right hip: Secondary | ICD-10-CM | POA: Diagnosis not present

## 2021-09-19 DIAGNOSIS — R899 Unspecified abnormal finding in specimens from other organs, systems and tissues: Secondary | ICD-10-CM | POA: Diagnosis not present

## 2021-09-27 DIAGNOSIS — M25551 Pain in right hip: Secondary | ICD-10-CM | POA: Diagnosis not present

## 2021-10-13 DIAGNOSIS — M1611 Unilateral primary osteoarthritis, right hip: Secondary | ICD-10-CM | POA: Diagnosis not present

## 2021-11-30 ENCOUNTER — Other Ambulatory Visit (HOSPITAL_COMMUNITY): Payer: Self-pay

## 2022-07-04 DIAGNOSIS — M25551 Pain in right hip: Secondary | ICD-10-CM | POA: Diagnosis not present

## 2022-11-12 DIAGNOSIS — Z6838 Body mass index (BMI) 38.0-38.9, adult: Secondary | ICD-10-CM | POA: Diagnosis not present

## 2022-11-12 DIAGNOSIS — I10 Essential (primary) hypertension: Secondary | ICD-10-CM | POA: Diagnosis not present

## 2022-11-12 DIAGNOSIS — M48061 Spinal stenosis, lumbar region without neurogenic claudication: Secondary | ICD-10-CM | POA: Diagnosis not present

## 2022-11-12 DIAGNOSIS — K219 Gastro-esophageal reflux disease without esophagitis: Secondary | ICD-10-CM | POA: Diagnosis not present

## 2022-11-12 DIAGNOSIS — E559 Vitamin D deficiency, unspecified: Secondary | ICD-10-CM | POA: Diagnosis not present

## 2022-11-12 DIAGNOSIS — E1169 Type 2 diabetes mellitus with other specified complication: Secondary | ICD-10-CM | POA: Diagnosis not present

## 2022-11-12 DIAGNOSIS — R4586 Emotional lability: Secondary | ICD-10-CM | POA: Diagnosis not present

## 2022-11-13 ENCOUNTER — Other Ambulatory Visit (HOSPITAL_COMMUNITY): Payer: Self-pay

## 2022-11-13 MED ORDER — AMLODIPINE BESYLATE 10 MG PO TABS
10.0000 mg | ORAL_TABLET | Freq: Every day | ORAL | 3 refills | Status: DC
  Filled 2022-11-13: qty 90, 90d supply, fill #0
  Filled 2023-02-13: qty 90, 90d supply, fill #1
  Filled 2023-05-22: qty 90, 90d supply, fill #2
  Filled 2023-08-26: qty 90, 90d supply, fill #3

## 2022-11-13 MED ORDER — METFORMIN HCL 500 MG PO TABS
500.0000 mg | ORAL_TABLET | Freq: Two times a day (BID) | ORAL | 3 refills | Status: AC
  Filled 2022-11-13: qty 180, 90d supply, fill #0
  Filled 2023-02-13: qty 180, 90d supply, fill #1
  Filled 2023-08-26: qty 180, 90d supply, fill #2

## 2022-11-13 MED ORDER — ATORVASTATIN CALCIUM 80 MG PO TABS
80.0000 mg | ORAL_TABLET | Freq: Every day | ORAL | 3 refills | Status: DC
  Filled 2022-11-13: qty 90, 90d supply, fill #0
  Filled 2023-02-13: qty 90, 90d supply, fill #1
  Filled 2023-05-22: qty 90, 90d supply, fill #2
  Filled 2023-08-26: qty 90, 90d supply, fill #3

## 2022-11-13 MED ORDER — CHLORTHALIDONE 25 MG PO TABS
12.5000 mg | ORAL_TABLET | Freq: Every day | ORAL | 3 refills | Status: AC
  Filled 2022-11-13: qty 90, 90d supply, fill #0
  Filled 2023-02-13: qty 90, 90d supply, fill #1
  Filled 2023-08-26: qty 90, 90d supply, fill #2

## 2022-11-13 MED ORDER — PANTOPRAZOLE SODIUM 20 MG PO TBEC
20.0000 mg | DELAYED_RELEASE_TABLET | Freq: Two times a day (BID) | ORAL | 3 refills | Status: DC
  Filled 2022-11-13: qty 180, 90d supply, fill #0
  Filled 2023-02-13: qty 180, 90d supply, fill #1
  Filled 2023-05-22: qty 180, 90d supply, fill #2
  Filled 2023-08-26: qty 180, 90d supply, fill #3

## 2022-11-13 MED ORDER — DULOXETINE HCL 30 MG PO CPEP
30.0000 mg | ORAL_CAPSULE | Freq: Every day | ORAL | 0 refills | Status: DC
  Filled 2022-11-13: qty 30, 30d supply, fill #0

## 2022-11-14 ENCOUNTER — Other Ambulatory Visit (HOSPITAL_COMMUNITY): Payer: Self-pay

## 2022-11-19 ENCOUNTER — Other Ambulatory Visit (HOSPITAL_COMMUNITY): Payer: Self-pay

## 2022-11-19 MED ORDER — ONDANSETRON 4 MG PO TBDP
4.0000 mg | ORAL_TABLET | Freq: Three times a day (TID) | ORAL | 0 refills | Status: AC | PRN
  Filled 2022-11-19: qty 30, 10d supply, fill #0

## 2022-11-21 DIAGNOSIS — M25551 Pain in right hip: Secondary | ICD-10-CM | POA: Diagnosis not present

## 2022-12-12 ENCOUNTER — Other Ambulatory Visit (HOSPITAL_COMMUNITY): Payer: Self-pay

## 2022-12-12 MED ORDER — DULOXETINE HCL 30 MG PO CPEP
30.0000 mg | ORAL_CAPSULE | Freq: Every day | ORAL | 0 refills | Status: DC
  Filled 2022-12-12: qty 30, 30d supply, fill #0

## 2022-12-27 ENCOUNTER — Other Ambulatory Visit (HOSPITAL_COMMUNITY): Payer: Self-pay

## 2022-12-27 DIAGNOSIS — R0982 Postnasal drip: Secondary | ICD-10-CM | POA: Diagnosis not present

## 2022-12-27 DIAGNOSIS — I10 Essential (primary) hypertension: Secondary | ICD-10-CM | POA: Diagnosis not present

## 2022-12-27 DIAGNOSIS — Z6837 Body mass index (BMI) 37.0-37.9, adult: Secondary | ICD-10-CM | POA: Diagnosis not present

## 2022-12-27 DIAGNOSIS — D72829 Elevated white blood cell count, unspecified: Secondary | ICD-10-CM | POA: Diagnosis not present

## 2022-12-27 DIAGNOSIS — E1169 Type 2 diabetes mellitus with other specified complication: Secondary | ICD-10-CM | POA: Diagnosis not present

## 2022-12-27 MED ORDER — IPRATROPIUM BROMIDE 0.03 % NA SOLN
2.0000 | Freq: Two times a day (BID) | NASAL | 3 refills | Status: AC
  Filled 2022-12-27: qty 30, 30d supply, fill #0
  Filled 2023-02-13: qty 30, 30d supply, fill #1

## 2023-01-09 ENCOUNTER — Other Ambulatory Visit (HOSPITAL_COMMUNITY): Payer: Self-pay

## 2023-01-10 ENCOUNTER — Other Ambulatory Visit (HOSPITAL_COMMUNITY): Payer: Self-pay

## 2023-01-10 MED ORDER — DULOXETINE HCL 30 MG PO CPEP
30.0000 mg | ORAL_CAPSULE | Freq: Every day | ORAL | 2 refills | Status: DC
  Filled 2023-01-10: qty 90, 90d supply, fill #0
  Filled 2023-04-09: qty 90, 90d supply, fill #1
  Filled 2023-07-11: qty 90, 90d supply, fill #2

## 2023-02-14 ENCOUNTER — Other Ambulatory Visit (HOSPITAL_COMMUNITY): Payer: Self-pay

## 2023-02-21 ENCOUNTER — Other Ambulatory Visit (HOSPITAL_COMMUNITY): Payer: Self-pay

## 2023-02-27 ENCOUNTER — Other Ambulatory Visit (HOSPITAL_COMMUNITY): Payer: Self-pay

## 2023-09-03 ENCOUNTER — Other Ambulatory Visit (HOSPITAL_COMMUNITY): Payer: Self-pay

## 2023-09-03 DIAGNOSIS — Z1239 Encounter for other screening for malignant neoplasm of breast: Secondary | ICD-10-CM | POA: Diagnosis not present

## 2023-09-03 DIAGNOSIS — I10 Essential (primary) hypertension: Secondary | ICD-10-CM | POA: Diagnosis not present

## 2023-09-03 DIAGNOSIS — E559 Vitamin D deficiency, unspecified: Secondary | ICD-10-CM | POA: Diagnosis not present

## 2023-09-03 DIAGNOSIS — Z6841 Body Mass Index (BMI) 40.0 and over, adult: Secondary | ICD-10-CM | POA: Diagnosis not present

## 2023-09-03 DIAGNOSIS — E1169 Type 2 diabetes mellitus with other specified complication: Secondary | ICD-10-CM | POA: Diagnosis not present

## 2023-09-03 DIAGNOSIS — M48061 Spinal stenosis, lumbar region without neurogenic claudication: Secondary | ICD-10-CM | POA: Diagnosis not present

## 2023-09-03 DIAGNOSIS — K219 Gastro-esophageal reflux disease without esophagitis: Secondary | ICD-10-CM | POA: Diagnosis not present

## 2023-09-03 DIAGNOSIS — Z23 Encounter for immunization: Secondary | ICD-10-CM | POA: Diagnosis not present

## 2023-09-03 DIAGNOSIS — Z0000 Encounter for general adult medical examination without abnormal findings: Secondary | ICD-10-CM | POA: Diagnosis not present

## 2023-09-03 MED ORDER — DULOXETINE HCL 30 MG PO CPEP
30.0000 mg | ORAL_CAPSULE | Freq: Every day | ORAL | 2 refills | Status: DC
  Filled 2023-10-07: qty 90, 90d supply, fill #0
  Filled 2024-01-02: qty 90, 90d supply, fill #1
  Filled 2024-04-08 (×2): qty 90, 90d supply, fill #2

## 2023-09-03 MED ORDER — ATORVASTATIN CALCIUM 80 MG PO TABS
80.0000 mg | ORAL_TABLET | Freq: Every day | ORAL | 3 refills | Status: AC
  Filled 2024-01-02: qty 90, 90d supply, fill #0
  Filled 2024-04-08 (×2): qty 90, 90d supply, fill #1
  Filled 2024-07-15: qty 90, 90d supply, fill #2

## 2023-09-03 MED ORDER — AMLODIPINE BESYLATE 10 MG PO TABS
10.0000 mg | ORAL_TABLET | Freq: Every day | ORAL | 3 refills | Status: AC
  Filled 2023-11-28: qty 90, 90d supply, fill #0
  Filled 2024-03-06: qty 90, 90d supply, fill #1
  Filled 2024-06-21: qty 90, 90d supply, fill #2

## 2023-09-03 MED ORDER — METFORMIN HCL 500 MG PO TABS
500.0000 mg | ORAL_TABLET | Freq: Two times a day (BID) | ORAL | 3 refills | Status: AC
  Filled 2024-01-02: qty 180, 90d supply, fill #0
  Filled 2024-07-15: qty 180, 90d supply, fill #1

## 2023-09-03 MED ORDER — PANTOPRAZOLE SODIUM 20 MG PO TBEC
20.0000 mg | DELAYED_RELEASE_TABLET | Freq: Two times a day (BID) | ORAL | 3 refills | Status: AC
  Filled 2023-11-28: qty 180, 90d supply, fill #0
  Filled 2024-03-06: qty 180, 90d supply, fill #1
  Filled 2024-06-21: qty 180, 90d supply, fill #2

## 2023-09-03 MED ORDER — CHLORTHALIDONE 25 MG PO TABS
12.5000 mg | ORAL_TABLET | Freq: Every day | ORAL | 3 refills | Status: DC
  Filled 2024-01-02: qty 90, 90d supply, fill #0
  Filled 2024-07-15: qty 90, 90d supply, fill #1

## 2023-10-07 ENCOUNTER — Other Ambulatory Visit (HOSPITAL_COMMUNITY): Payer: Self-pay

## 2023-10-07 DIAGNOSIS — R110 Nausea: Secondary | ICD-10-CM | POA: Diagnosis not present

## 2023-10-07 DIAGNOSIS — R0981 Nasal congestion: Secondary | ICD-10-CM | POA: Diagnosis not present

## 2023-10-07 DIAGNOSIS — R053 Chronic cough: Secondary | ICD-10-CM | POA: Diagnosis not present

## 2023-10-07 DIAGNOSIS — J209 Acute bronchitis, unspecified: Secondary | ICD-10-CM | POA: Diagnosis not present

## 2023-10-07 MED ORDER — PREDNISONE 50 MG PO TABS
50.0000 mg | ORAL_TABLET | Freq: Every day | ORAL | 0 refills | Status: AC
  Filled 2023-10-07: qty 5, 5d supply, fill #0

## 2023-10-07 MED ORDER — ALBUTEROL SULFATE HFA 108 (90 BASE) MCG/ACT IN AERS
2.0000 | INHALATION_SPRAY | RESPIRATORY_TRACT | 1 refills | Status: AC | PRN
  Filled 2023-10-07: qty 6.7, 17d supply, fill #0

## 2023-10-07 MED ORDER — ONDANSETRON 4 MG PO TBDP
4.0000 mg | ORAL_TABLET | Freq: Three times a day (TID) | ORAL | 0 refills | Status: AC | PRN
  Filled 2023-10-07: qty 10, 4d supply, fill #0

## 2023-10-07 MED ORDER — DOXYCYCLINE HYCLATE 100 MG PO TABS
100.0000 mg | ORAL_TABLET | Freq: Two times a day (BID) | ORAL | 0 refills | Status: AC
  Filled 2023-10-07: qty 20, 10d supply, fill #0

## 2023-11-28 ENCOUNTER — Other Ambulatory Visit: Payer: Self-pay

## 2023-11-28 ENCOUNTER — Other Ambulatory Visit (HOSPITAL_COMMUNITY): Payer: Self-pay

## 2023-12-21 DIAGNOSIS — R300 Dysuria: Secondary | ICD-10-CM | POA: Diagnosis not present

## 2023-12-21 DIAGNOSIS — N3000 Acute cystitis without hematuria: Secondary | ICD-10-CM | POA: Diagnosis not present

## 2024-01-02 ENCOUNTER — Other Ambulatory Visit: Payer: Self-pay

## 2024-01-26 DIAGNOSIS — N390 Urinary tract infection, site not specified: Secondary | ICD-10-CM | POA: Diagnosis not present

## 2024-03-04 DIAGNOSIS — Z6841 Body Mass Index (BMI) 40.0 and over, adult: Secondary | ICD-10-CM | POA: Diagnosis not present

## 2024-03-04 DIAGNOSIS — N390 Urinary tract infection, site not specified: Secondary | ICD-10-CM | POA: Diagnosis not present

## 2024-03-04 DIAGNOSIS — E1169 Type 2 diabetes mellitus with other specified complication: Secondary | ICD-10-CM | POA: Diagnosis not present

## 2024-03-04 DIAGNOSIS — I10 Essential (primary) hypertension: Secondary | ICD-10-CM | POA: Diagnosis not present

## 2024-03-04 DIAGNOSIS — K219 Gastro-esophageal reflux disease without esophagitis: Secondary | ICD-10-CM | POA: Diagnosis not present

## 2024-03-04 DIAGNOSIS — E559 Vitamin D deficiency, unspecified: Secondary | ICD-10-CM | POA: Diagnosis not present

## 2024-07-15 ENCOUNTER — Other Ambulatory Visit (HOSPITAL_COMMUNITY): Payer: Self-pay

## 2024-07-16 ENCOUNTER — Other Ambulatory Visit (HOSPITAL_COMMUNITY): Payer: Self-pay

## 2024-07-16 ENCOUNTER — Other Ambulatory Visit: Payer: Self-pay

## 2024-07-16 MED ORDER — DULOXETINE HCL 30 MG PO CPEP
30.0000 mg | ORAL_CAPSULE | Freq: Every day | ORAL | 0 refills | Status: DC
  Filled 2024-07-16: qty 90, 90d supply, fill #0

## 2024-09-02 ENCOUNTER — Other Ambulatory Visit (HOSPITAL_COMMUNITY): Payer: Self-pay

## 2024-09-02 ENCOUNTER — Encounter (HOSPITAL_COMMUNITY): Payer: Self-pay

## 2024-09-02 MED ORDER — ATORVASTATIN CALCIUM 80 MG PO TABS
80.0000 mg | ORAL_TABLET | Freq: Every day | ORAL | 3 refills | Status: AC
  Filled 2024-09-30: qty 90, 90d supply, fill #0

## 2024-09-02 MED ORDER — DULOXETINE HCL 30 MG PO CPEP
60.0000 mg | ORAL_CAPSULE | Freq: Every day | ORAL | 0 refills | Status: AC
  Filled 2024-09-30: qty 180, 90d supply, fill #0

## 2024-09-02 MED ORDER — METFORMIN HCL 500 MG PO TABS
500.0000 mg | ORAL_TABLET | Freq: Two times a day (BID) | ORAL | 3 refills | Status: AC
  Filled 2024-09-30: qty 180, 90d supply, fill #0

## 2024-09-02 MED ORDER — AMLODIPINE BESYLATE 10 MG PO TABS
10.0000 mg | ORAL_TABLET | Freq: Every day | ORAL | 3 refills | Status: AC
  Filled 2024-09-30: qty 90, 90d supply, fill #0

## 2024-09-02 MED ORDER — PANTOPRAZOLE SODIUM 20 MG PO TBEC
20.0000 mg | DELAYED_RELEASE_TABLET | Freq: Two times a day (BID) | ORAL | 3 refills | Status: AC
  Filled 2024-09-02 – 2024-09-30 (×2): qty 180, 90d supply, fill #0

## 2024-09-30 ENCOUNTER — Other Ambulatory Visit (HOSPITAL_COMMUNITY): Payer: Self-pay

## 2024-10-01 ENCOUNTER — Other Ambulatory Visit (HOSPITAL_COMMUNITY): Payer: Self-pay

## 2024-10-01 ENCOUNTER — Other Ambulatory Visit: Payer: Self-pay

## 2024-10-01 MED ORDER — CHLORTHALIDONE 25 MG PO TABS
12.5000 mg | ORAL_TABLET | Freq: Every day | ORAL | 1 refills | Status: AC
  Filled 2024-10-01: qty 90, 90d supply, fill #0
